# Patient Record
Sex: Male | Born: 1965 | Race: Black or African American | Hispanic: No | Marital: Married | Smoking: Never smoker
Health system: Southern US, Community
[De-identification: ages and names within clinical notes are randomized; demographics above are authoritative.]

## PROBLEM LIST (undated history)

## (undated) DIAGNOSIS — E785 Hyperlipidemia, unspecified: Secondary | ICD-10-CM

## (undated) DIAGNOSIS — G4700 Insomnia, unspecified: Secondary | ICD-10-CM

## (undated) DIAGNOSIS — I10 Essential (primary) hypertension: Secondary | ICD-10-CM

## (undated) DIAGNOSIS — E119 Type 2 diabetes mellitus without complications: Secondary | ICD-10-CM

## (undated) HISTORY — DX: Hyperlipidemia, unspecified: E78.5

## (undated) HISTORY — DX: Essential (primary) hypertension: I10

## (undated) HISTORY — DX: Type 2 diabetes mellitus without complications: E11.9

## (undated) HISTORY — DX: Insomnia, unspecified: G47.00

## (undated) HISTORY — PX: WISDOM TOOTH EXTRACTION: SHX21

---

## 1997-12-09 ENCOUNTER — Emergency Department (HOSPITAL_COMMUNITY): Admission: EM | Admit: 1997-12-09 | Discharge: 1997-12-09 | Payer: Self-pay | Admitting: Emergency Medicine

## 2011-08-19 ENCOUNTER — Emergency Department (HOSPITAL_COMMUNITY): Payer: No Typology Code available for payment source

## 2011-08-19 ENCOUNTER — Emergency Department (HOSPITAL_COMMUNITY)
Admission: EM | Admit: 2011-08-19 | Discharge: 2011-08-19 | Disposition: A | Discharge status: Home / Self Care | Payer: No Typology Code available for payment source | Attending: Emergency Medicine | Admitting: Emergency Medicine

## 2011-08-19 DIAGNOSIS — R51 Headache: Secondary | ICD-10-CM | POA: Insufficient documentation

## 2011-08-19 DIAGNOSIS — T1490XA Injury, unspecified, initial encounter: Secondary | ICD-10-CM | POA: Insufficient documentation

## 2011-08-19 DIAGNOSIS — M549 Dorsalgia, unspecified: Secondary | ICD-10-CM | POA: Insufficient documentation

## 2011-08-19 MED ORDER — OXYCODONE-ACETAMINOPHEN 5-325 MG PO TABS
1.0000 | ORAL_TABLET | Freq: Once | ORAL | Status: AC
  Administered 2011-08-19: 1 via ORAL
  Filled 2011-08-19: qty 1

## 2011-08-19 MED ORDER — OXYCODONE-ACETAMINOPHEN 5-325 MG PO TABS: 2.0000 | ORAL_TABLET | ORAL | Status: AC | PRN
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
## 2011-08-19 NOTE — ED Notes (Signed)
Patient transported to X-ray and returned 

## 2011-08-19 NOTE — ED Notes (Signed)
Rx given to pt. Family at bedside

## 2012-01-09 ENCOUNTER — Emergency Department: Payer: Self-pay | Admitting: Emergency Medicine
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
## 2013-05-12 IMAGING — CT CT HEAD WITHOUT CONTRAST
1 series · 16 of 30 positions shown, 20 images · non-contrast
Comparison: none

REASON FOR EXAM: headache
COMMENTS:

[Series 2: soft tissue · axial · 0.41mm/px · z∈[+397,+532]mm · 16 of 30 slices shown, 20 images]
[im 2/30  brain]
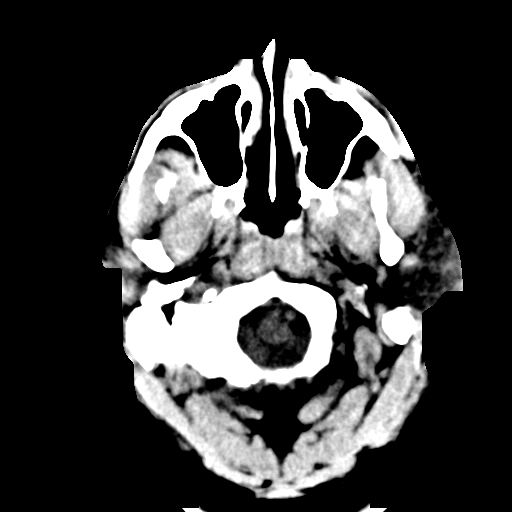
[im 2/30  bone]
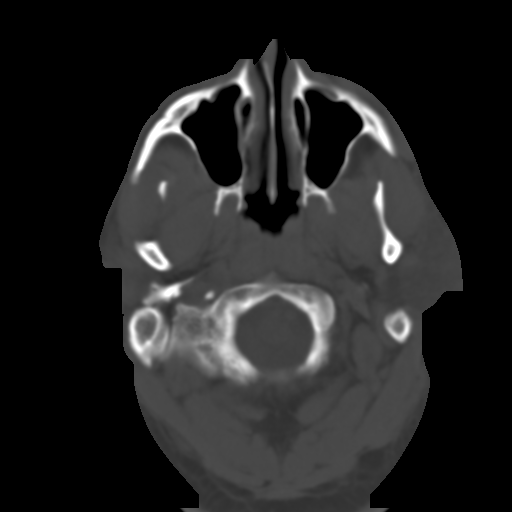
[im 4/30  brain]
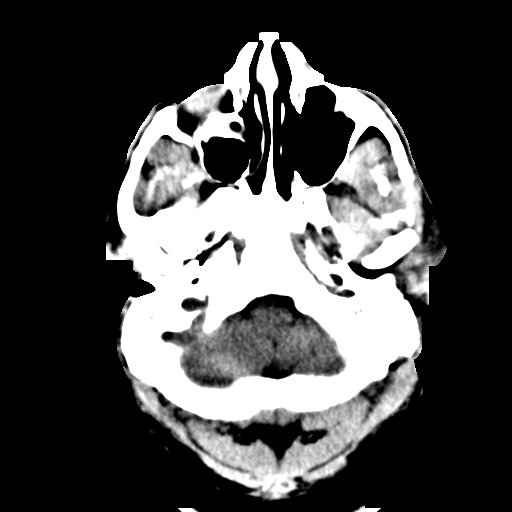
[im 6/30  brain]
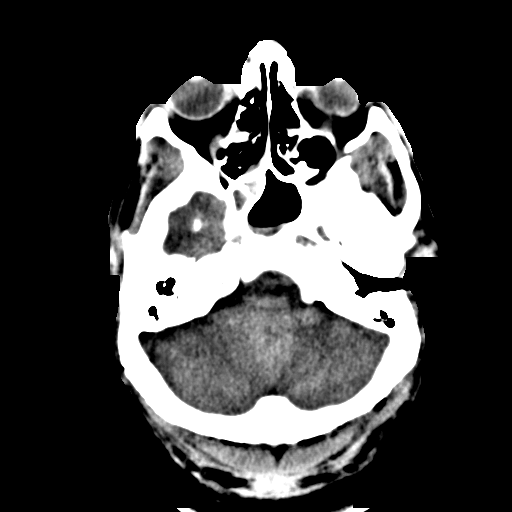
[im 8/30  brain]
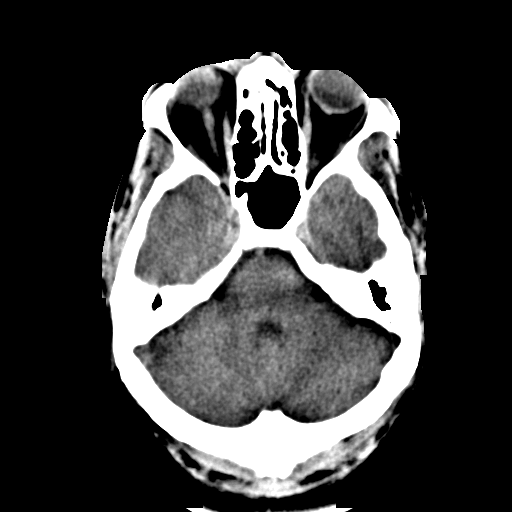
[im 9/30  brain]
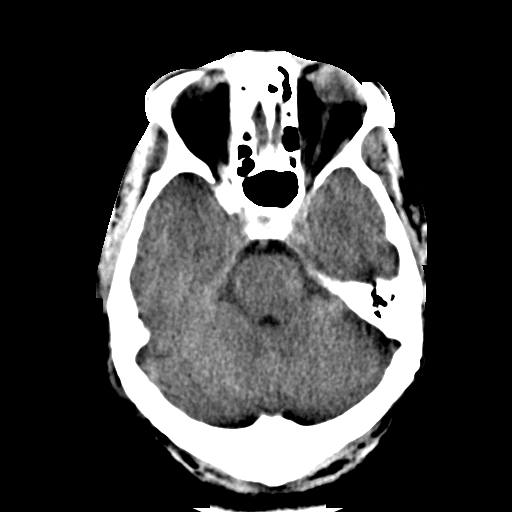
[im 9/30  bone]
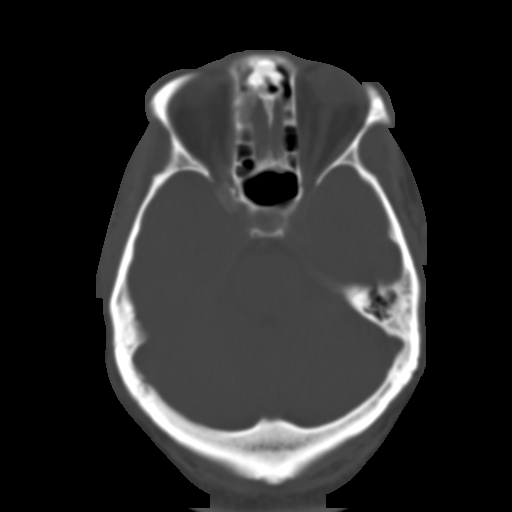
[im 11/30  brain]
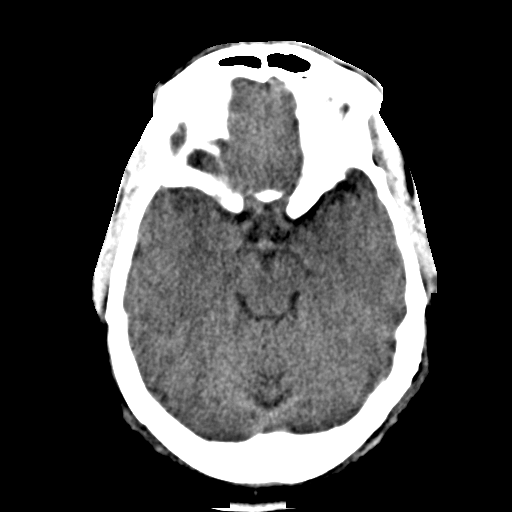
[im 13/30  brain]
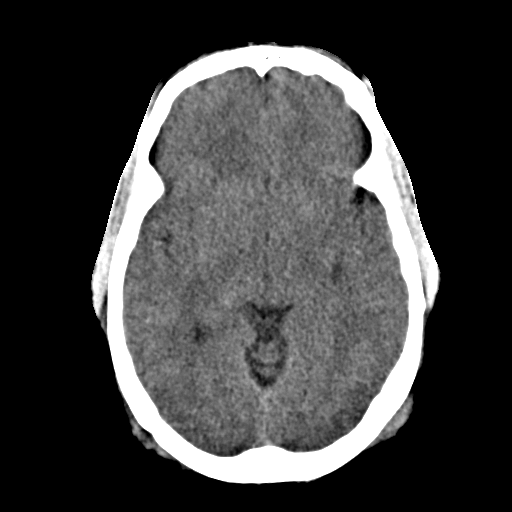
[im 15/30  brain]
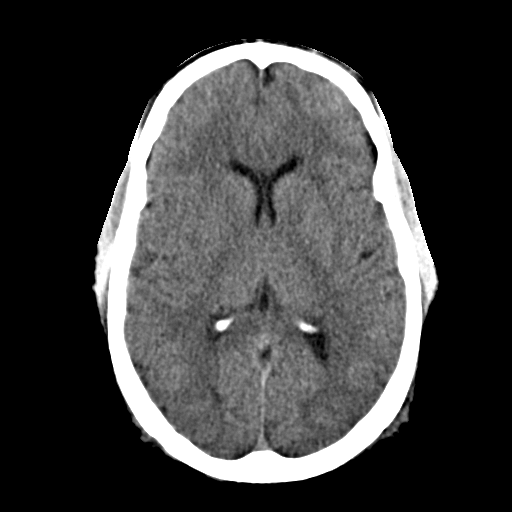
[im 16/30  brain]
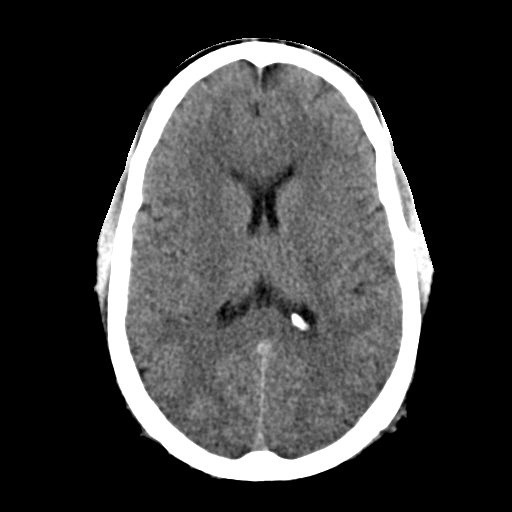
[im 16/30  bone]
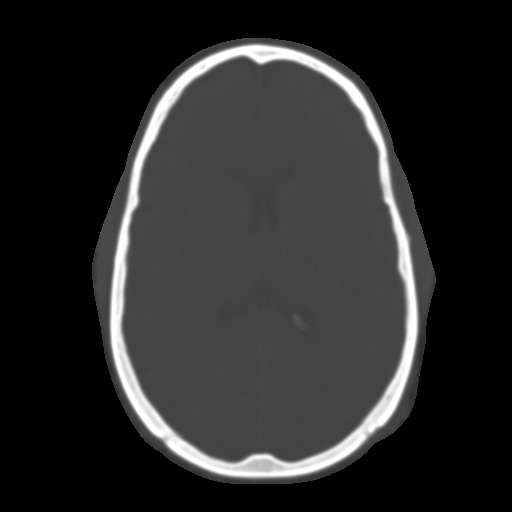
[im 18/30  brain]
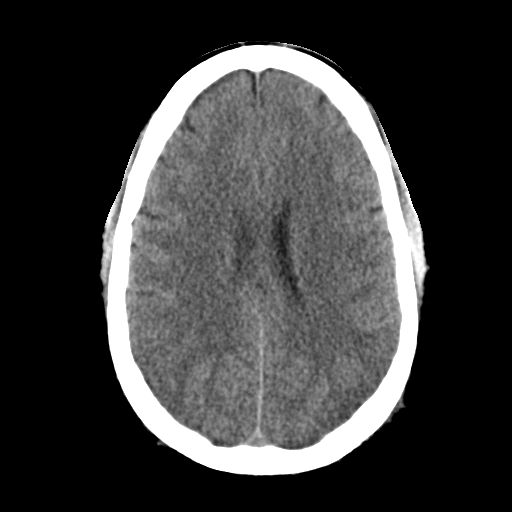
[im 20/30  brain]
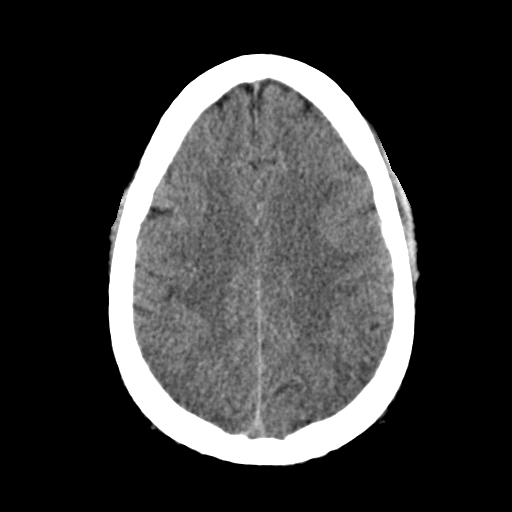
[im 22/30  brain]
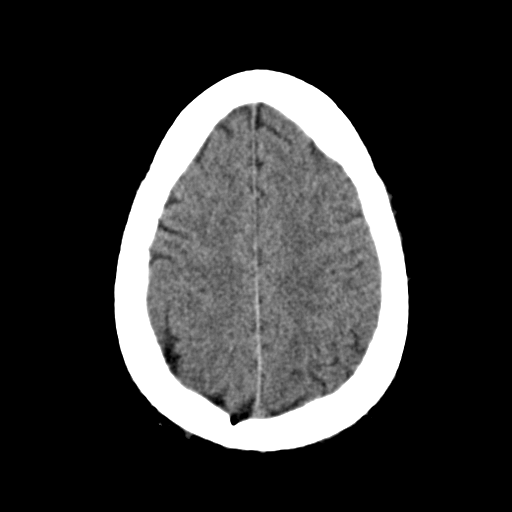
[im 23/30  brain]
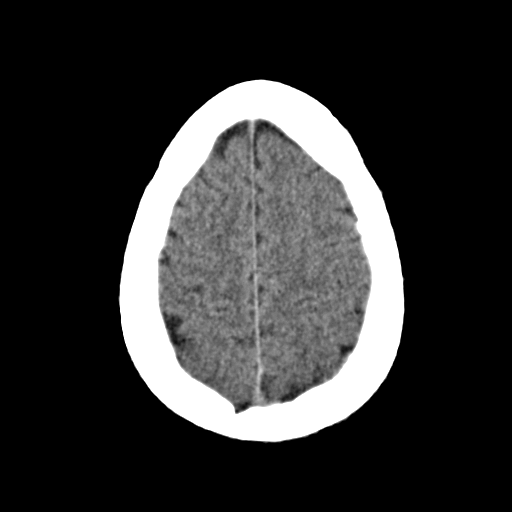
[im 23/30  bone]
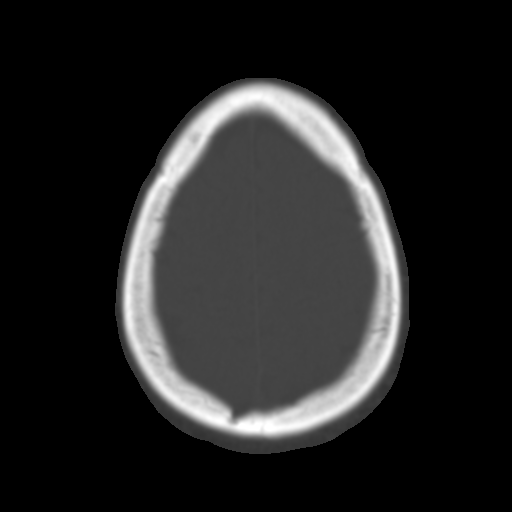
[im 25/30  brain]
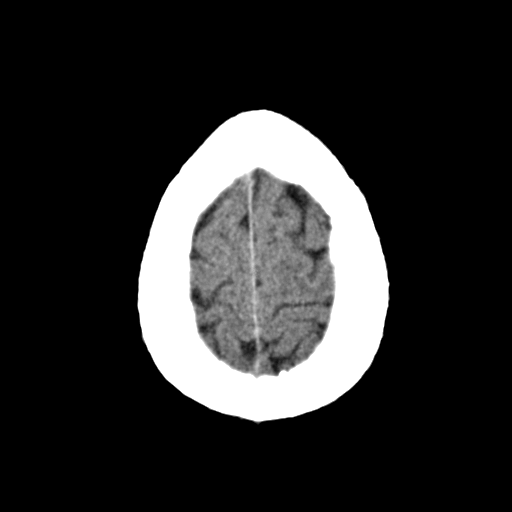
[im 27/30  brain]
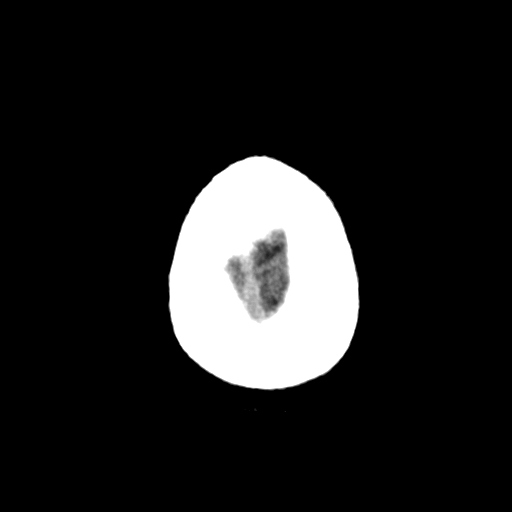
[im 29/30  brain]
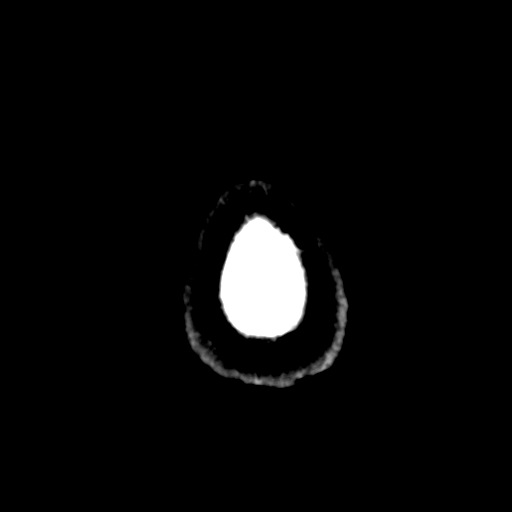

[16 of 30 positions shown; findings below may reference images not displayed]

PROCEDURE:     CT  - CT HEAD WITHOUT CONTRAST  - January 09, 2012  [DATE]

RESULT:     Axial noncontrast CT scanning was performed through the brain
with reconstructions at 5 mm intervals and slice thicknesses.

The ventricles are normal in size and position. There is no intracranial
hemorrhage nor intracranial mass effect. The cerebellum and brainstem are
normal in density.

At bone window settings there is fluid in the sphenoid sinus and in a few
posterior ethmoid sinus cells. There is no evidence of an acute skull
fracture.
IMPRESSION: 1. I see no acute abnormality the brain.
2. There is fluid in the sphenoid and posterior ethmoid sinus cells which
may reflect acute or chronic sinus inflammation.

## 2014-08-21 DIAGNOSIS — E1159 Type 2 diabetes mellitus with other circulatory complications: Secondary | ICD-10-CM | POA: Insufficient documentation

## 2014-08-21 DIAGNOSIS — E1169 Type 2 diabetes mellitus with other specified complication: Secondary | ICD-10-CM | POA: Insufficient documentation

## 2014-08-21 DIAGNOSIS — E669 Obesity, unspecified: Secondary | ICD-10-CM | POA: Insufficient documentation

## 2014-09-01 DIAGNOSIS — E669 Obesity, unspecified: Secondary | ICD-10-CM | POA: Insufficient documentation

## 2016-11-08 ENCOUNTER — Ambulatory Visit (INDEPENDENT_AMBULATORY_CARE_PROVIDER_SITE_OTHER): Payer: BLUE CROSS/BLUE SHIELD | Admitting: Podiatry

## 2016-11-08 ENCOUNTER — Encounter: Payer: Self-pay | Admitting: Podiatry

## 2016-11-08 VITALS — BP 163/105 | HR 92 | Resp 18

## 2016-11-08 DIAGNOSIS — L600 Ingrowing nail: Secondary | ICD-10-CM | POA: Diagnosis not present

## 2016-11-08 MED ORDER — CEPHALEXIN 500 MG PO CAPS: 500.0000 mg | ORAL_CAPSULE | Freq: Four times a day (QID) | ORAL | 0 refills | Status: DC

## 2016-11-08 NOTE — Patient Instructions (Signed)

## 2016-11-08 NOTE — Progress Notes (Signed)
Subjective:    Patient ID: Kenneth Cooper, male    DOB: [birth year], 51 y.o.   MRN: [id number]  HPI  51 year old male presents the office they for concerns of ingrown toenail to the right big toe and he points the lateral nail border. He states the areas painful pressure in shoes. His been ongoing for several weeks. He states this started to drain some. Has had no recent treatment for this. Denies any recent injury or trauma. He is no other complaints today.  He is unsure of his last A1c her blood sugar he does not check her regularly. He denies any claudication symptoms and denies any numbness or tingling.  Review of Systems  All other systems reviewed and are negative.      Objective:   Physical Exam General: AAO x3, NAD  Dermatological: There is incurvation along the lateral aspect of the right hallux toenail tenderness palpation. This granular lesion tissue present within the nail border in a small amount of serous drainage is expressed. There is no pus. There is mild edema along the nail border but there is no ascending cellulitis. There is no other open lesions or pre-ulcerative lesions. There is no sign of the incurvation along the medial nail border today there is no pain in the area.  Vascular: Dorsalis Pedis artery and Posterior Tibial artery pedal pulses are 2/4 bilateral with immedate capillary fill time. Pedal hair growth present. No varicosities and no lower extremity edema present bilateral. There is no pain with calf compression, swelling, warmth, erythema.   Neruologic: Grossly intact via light touch bilateral. Vibratory intact via tuning fork bilateral. Protective threshold with Semmes Wienstein monofilament intact to all pedal sites bilateral.   Musculoskeletal: No gross boney pedal deformities bilateral. No pain, crepitus, or limitation noted with foot and ankle range of motion bilateral. Muscular strength 5/5 in all groups tested bilateral.  Gait: Unassisted,  Nonantalgic.      Assessment & Plan:  51 year old male right lateral hallux and medical ingrown toenail -Treatment options discussed including all alternatives, risks, and complications -Etiology of symptoms were discussed -At this time, the patient is requesting partial nail removal with chemical matricectomy to the symptomatic portion of the nail. Risks and complications were discussed with the patient for which they understand and  verbally consent to the procedure. Under sterile conditions a total of 3 mL of a mixture of 2% lidocaine plain and 0.5% Marcaine plain was infiltrated in a hallux block fashion. Once anesthetized, the skin was prepped in sterile fashion. A tourniquet was then applied. Next the lateral aspect of hallux nail border was then sharply excised making sure to remove the entire offending nail border. Once the nails were ensured to be removed area was debrided and the underlying skin was intact. There is no purulence identified in the procedure. Next phenol was then applied under standard conditions and copiously irrigated. Silvadene was applied. A dry sterile dressing was applied. After application of the dressing the tourniquet was removed and there is found to be an immediate capillary refill time to the digit. The patient tolerated the procedure well any complications. Post procedure instructions were discussed the patient for which he verbally understood. Follow-up in one week for nail check or sooner if any problems are to arise. Discussed signs/symptoms of infection and directed to call the office immediately should any occur or go directly to the emergency room. In the meantime, encouraged to call the office with any questions, concerns, changes symptoms. -Keflex  Kenneth Cooper, DPM

## 2016-11-20 ENCOUNTER — Ambulatory Visit: Payer: 59 | Admitting: Podiatry

## 2016-12-04 ENCOUNTER — Encounter: Payer: Self-pay | Admitting: Podiatry

## 2016-12-04 ENCOUNTER — Ambulatory Visit (INDEPENDENT_AMBULATORY_CARE_PROVIDER_SITE_OTHER): Payer: BLUE CROSS/BLUE SHIELD | Admitting: Podiatry

## 2016-12-04 DIAGNOSIS — S91209D Unspecified open wound of unspecified toe(s) with damage to nail, subsequent encounter: Secondary | ICD-10-CM

## 2016-12-04 DIAGNOSIS — M79676 Pain in unspecified toe(s): Secondary | ICD-10-CM

## 2016-12-04 DIAGNOSIS — S91109D Unspecified open wound of unspecified toe(s) without damage to nail, subsequent encounter: Secondary | ICD-10-CM | POA: Diagnosis not present

## 2016-12-04 NOTE — Progress Notes (Signed)
   Subjective: Patient presents today 2 weeks post ingrown nail permanent nail avulsion procedure. Patient states that the toe and nail fold is feeling much better.  Objective: Skin is warm, dry and supple. Nail and respective nail fold appears to be healing appropriately. Open wound to the associated nail fold with a granular wound base and moderate amount of fibrotic tissue. Minimal drainage noted. Mild erythema around the periungual region likely due to phenol chemical matricectomy.  Assessment: #1 postop permanent partial nail avulsion #2 open wound periungual nail fold of respective digit.   Plan of care: #1 patient was evaluated  #2 debridement of open wound was performed to the periungual border of the respective toe using a currette. Antibiotic ointment and Band-Aid was applied. #3 patient is to return to clinic on a PRN  basis.   Claron Rosencrans M. Saleen Peden, DPM [facility]  Dr. Fortune Brannigan M. Briana Newman, DPM    [address]                                        [place], [place] [address]                Office [phone]  Fax [phone]      

## 2017-05-14 DIAGNOSIS — E1169 Type 2 diabetes mellitus with other specified complication: Secondary | ICD-10-CM | POA: Insufficient documentation

## 2020-04-12 DIAGNOSIS — G4733 Obstructive sleep apnea (adult) (pediatric): Secondary | ICD-10-CM | POA: Insufficient documentation

## 2021-04-14 LAB — HM DIABETES EYE EXAM

## 2021-11-02 ENCOUNTER — Ambulatory Visit
Admission: EM | Admit: 2021-11-02 | Discharge: 2021-11-02 | Disposition: A | Discharge status: Home / Self Care | Payer: BC Managed Care – PPO | Attending: Emergency Medicine | Admitting: Emergency Medicine

## 2021-11-02 ENCOUNTER — Other Ambulatory Visit: Payer: Self-pay

## 2021-11-02 DIAGNOSIS — S29019A Strain of muscle and tendon of unspecified wall of thorax, initial encounter: Secondary | ICD-10-CM | POA: Diagnosis not present

## 2021-11-02 DIAGNOSIS — Z8679 Personal history of other diseases of the circulatory system: Secondary | ICD-10-CM | POA: Diagnosis not present

## 2021-11-02 MED ORDER — HYDROCODONE-ACETAMINOPHEN 5-325 MG PO TABS: 1.0000 | ORAL_TABLET | Freq: Four times a day (QID) | ORAL | 0 refills | Status: AC | PRN

## 2021-11-02 MED ORDER — HYDROCODONE-ACETAMINOPHEN 5-325 MG PO TABS: 2.0000 | ORAL_TABLET | Freq: Four times a day (QID) | ORAL | 0 refills | Status: DC | PRN

## 2021-11-02 MED ORDER — CYCLOBENZAPRINE HCL 10 MG PO TABS: 10.0000 mg | ORAL_TABLET | Freq: Two times a day (BID) | ORAL | 0 refills | Status: AC | PRN

## 2021-11-02 NOTE — ED Provider Notes (Signed)
[facility]    CSN: [id number] Arrival date & time: 11/02/21  1601      History   Chief Complaint Chief Complaint  Patient presents with   Back Pain    HPI Kenneth Cooper is a 56 y.o. male.   56 year old male patient, Kenneth Cooper, presents to urgent care for chief complaint of right-sided thoracic back pain since yesterday.  Patient states he was lifting something heavy and felt a twinge on the right side.  Patient states he has been taken over-the-counter back pain medicine without relief.  Patient is past medical history of diabetes and hypertension which is controlled.  PCP is Dr. Dareen Piano in [place] [facility].  The history is provided by the patient. No language interpreter was used.  Back Pain Location:  Thoracic spine Quality:  Aching Radiates to:  Does not radiate Pain severity:  Severe Pain is:  Same all the time Onset quality:  Sudden Duration:  1 day Timing:  Constant Progression:  Waxing and waning Chronicity:  New Context: lifting heavy objects   Relieved by:  Nothing Worsened by:  Movement Ineffective treatments:  OTC medications Associated symptoms: no fever    Past Medical History:  Diagnosis Date   Diabetes mellitus without complication (HCC)    Hypertension     Patient Active Problem List   Diagnosis Date Noted   History of hypertension 11/02/2021   Thoracic myofascial strain 11/02/2021   Ingrown toenail 11/08/2016    History reviewed. No pertinent surgical history.     Home Medications    Prior to Admission medications   Medication Sig Start Date End Date Taking? Authorizing Provider  amLODipine (NORVASC) 5 MG tablet Take 5 mg by mouth daily. 10/21/16  Yes [provider]  cyclobenzaprine (FLEXERIL) 10 MG tablet Take 1 tablet (10 mg total) by mouth 2 (two) times daily as needed for up to 5 days for muscle spasms. 11/02/21 11/07/21 Yes Persia Lintner, Para March, NP  HYDROcodone-acetaminophen (NORCO/VICODIN) 5-325 MG tablet Take 1  tablet by mouth every 6 (six) hours as needed for up to 3 days. 11/02/21 11/05/21 Yes Rafia Shedden, Para March, NP  losartan (COZAAR) 100 MG tablet TAKE 1 TABLET (100 MG TOTAL) BY MOUTH ONCE DAILY. 10/08/16  Yes [provider]  losartan-hydrochlorothiazide (HYZAAR) 100-12.5 MG tablet Take by mouth. 10/17/16 10/17/17  [provider]  metFORMIN (GLUCOPHAGE-XR) 500 MG 24 hr tablet Take by mouth. 10/19/16 10/19/17  [provider]    Family History History reviewed. No pertinent family history.  Social History Social History   Tobacco Use   Smoking status: Never   Smokeless tobacco: Never  Vaping Use   Vaping Use: Never used  Substance Use Topics   Alcohol use: No   Drug use: No     Allergies   Patient has no known allergies.   Review of Systems Review of Systems  Constitutional:  Negative for fever.  Musculoskeletal:  Positive for back pain and myalgias.  Skin:  Negative for rash.  All other systems reviewed and are negative.   Physical Exam Triage Vital Signs ED Triage Vitals  Enc Vitals Group     BP      Pulse      Resp      Temp      Temp src      SpO2      Weight      Height      Head Circumference      Peak Flow  Pain Score      Pain Loc      Pain Edu?      Excl. in GC?    No data found.  Updated Vital Signs BP (!) 154/89 (BP Location: Left Arm)    Pulse 83    Temp 98.2 F (36.8 C) (Oral)    Resp 18    Ht 5\' 6"  (1.676 m)    Wt 237 lb (107.5 kg)    SpO2 100%    BMI 38.25 kg/m   Visual Acuity Right Eye Distance:   Left Eye Distance:   Bilateral Distance:    Right Eye Near:   Left Eye Near:    Bilateral Near:     Physical Exam Vitals and nursing note reviewed.  Constitutional:      General: He is not in acute distress.    Appearance: Normal appearance. He is well-developed. He is not ill-appearing or toxic-appearing.  HENT:     Head: Normocephalic.     Right Ear: Tympanic membrane normal.     Left Ear: Tympanic membrane  normal.     Nose: Nose normal.     Mouth/Throat:     Mouth: Mucous membranes are not dry.     Pharynx: Uvula midline.  Eyes:     General: Lids are normal.     Conjunctiva/sclera: Conjunctivae normal.     Pupils: Pupils are equal, round, and reactive to light.  Neck:     Trachea: Trachea normal.  Cardiovascular:     Rate and Rhythm: Normal rate and regular rhythm.     Pulses: Normal pulses.     Heart sounds: Normal heart sounds.  Pulmonary:     Effort: Pulmonary effort is normal.     Breath sounds: Normal breath sounds and air entry.  Abdominal:     General: Bowel sounds are normal.     Palpations: Abdomen is soft.     Tenderness: There is no abdominal tenderness. There is no guarding or rebound.     Hernia: No hernia is present.  Musculoskeletal:        General: Normal range of motion.     Cervical back: Normal range of motion.     Thoracic back: Spasms and tenderness present.       Back:  Skin:    General: Skin is warm and dry.     Findings: No rash.  Neurological:     General: No focal deficit present.     Mental Status: He is alert and oriented to person, place, and time.     GCS: GCS eye subscore is 4. GCS verbal subscore is 5. GCS motor subscore is 6.     Cranial Nerves: Cranial nerves 2-12 are intact.     Sensory: Sensation is intact.     Motor: Motor function is intact.     Coordination: Coordination is intact.  Psychiatric:        Attention and Perception: Attention normal.        Mood and Affect: Mood normal.        Speech: Speech normal.        Behavior: Behavior normal. Behavior is cooperative.     UC Treatments / Results  Labs (all labs ordered are listed, but only abnormal results are displayed) Labs Reviewed - No data to display  EKG   Radiology No results found.  Procedures Procedures (including critical care time)  Medications Ordered in UC Medications - No data to display  Initial Impression /  Assessment and Plan / UC Course  I have  reviewed the triage vital signs and the nursing notes.  Pertinent labs & imaging results that were available during my care of the patient were reviewed by me and considered in my medical decision making (see chart for details).     Ddx: Thoracic strain, muscle spasm Final Clinical Impressions(s) / UC Diagnoses   Final diagnoses:  History of hypertension  Thoracic myofascial strain, initial encounter     Discharge Instructions      Your blood pressure was elevated today, please follow up with PCP to have it rechecked. In a couple of days. Take meds as directed. Do not take meds and drive or work with them(it will make you sleepy).      ED Prescriptions     Medication Sig Dispense Auth. Provider   cyclobenzaprine (FLEXERIL) 10 MG tablet Take 1 tablet (10 mg total) by mouth 2 (two) times daily as needed for up to 5 days for muscle spasms. 10 tablet Cyndra Feinberg, NP   HYDROcodone-acetaminophen (NORCO/VICODIN) 5-325 MG tablet  (Status: Discontinued) Take 2 tablets by mouth every 6 (six) hours as needed for up to 3 days. 10 tablet Teckla Christiansen, NP   HYDROcodone-acetaminophen (NORCO/VICODIN) 5-325 MG tablet Take 1 tablet by mouth every 6 (six) hours as needed for up to 3 days. 10 tablet Ling Flesch, Para March, NP      I have reviewed the PDMP during this encounter.   Clancy Gourd, NP 11/02/21 [phone]

## 2021-11-02 NOTE — Discharge Instructions (Addendum)
Your blood pressure was elevated today, please follow up with PCP to have it rechecked. In a couple of days. Take meds as directed. Do not take meds and drive or work with them(it will make you sleepy).

## 2021-11-02 NOTE — ED Triage Notes (Signed)
Pt c/o right sided back pain near shoulder blade x1day   Pt was at work last night at work and put pressure on his right side and it caused his right shoulder to hurt.

## 2022-02-12 DIAGNOSIS — Z125 Encounter for screening for malignant neoplasm of prostate: Secondary | ICD-10-CM | POA: Diagnosis not present

## 2022-02-12 DIAGNOSIS — E119 Type 2 diabetes mellitus without complications: Secondary | ICD-10-CM | POA: Diagnosis not present

## 2022-02-12 DIAGNOSIS — Z23 Encounter for immunization: Secondary | ICD-10-CM | POA: Diagnosis not present

## 2022-02-12 DIAGNOSIS — Z0000 Encounter for general adult medical examination without abnormal findings: Secondary | ICD-10-CM | POA: Diagnosis not present

## 2022-02-12 DIAGNOSIS — Z1331 Encounter for screening for depression: Secondary | ICD-10-CM | POA: Diagnosis not present

## 2022-02-12 DIAGNOSIS — E782 Mixed hyperlipidemia: Secondary | ICD-10-CM | POA: Diagnosis not present

## 2022-02-12 DIAGNOSIS — I10 Essential (primary) hypertension: Secondary | ICD-10-CM | POA: Diagnosis not present

## 2022-06-20 ENCOUNTER — Ambulatory Visit
Admission: EM | Admit: 2022-06-20 | Discharge: 2022-06-20 | Disposition: A | Discharge status: Home / Self Care | Payer: BC Managed Care – PPO

## 2022-06-20 DIAGNOSIS — S29019A Strain of muscle and tendon of unspecified wall of thorax, initial encounter: Secondary | ICD-10-CM

## 2022-06-20 MED ORDER — PREDNISONE 10 MG (21) PO TBPK: ORAL_TABLET | ORAL | 0 refills | Status: DC

## 2022-06-20 MED ORDER — BACLOFEN 10 MG PO TABS: 10.0000 mg | ORAL_TABLET | Freq: Three times a day (TID) | ORAL | 0 refills | Status: DC

## 2022-06-20 MED ORDER — DEXAMETHASONE SODIUM PHOSPHATE 10 MG/ML IJ SOLN
10.0000 mg | Freq: Once | INTRAMUSCULAR | Status: AC
  Administered 2022-06-20: 10 mg via INTRAMUSCULAR

## 2022-06-20 NOTE — ED Provider Notes (Signed)
[facility]    CSN: [id number] Arrival date & time: 06/20/22  1951      History   Chief Complaint Chief Complaint  Patient presents with   Back Pain    HPI Kenneth Cooper is a 56 y.o. male.   HPI  89 old male here for evaluation of back pain.  Patient reports that he has had pain in his right mid back since he woke up this morning.  He states it is on the right side and increases with movement.  He took aspirin with mild relief of his pain.  He denies any injury or heavy lifting.  The pain does not radiate down his legs or crossover to the left side of his back.  Past Medical History:  Diagnosis Date   Diabetes mellitus without complication ([place])    Hypertension     Patient Active Problem List   Diagnosis Date Noted   History of hypertension 11/02/2021   Thoracic myofascial strain 11/02/2021   Ingrown toenail 11/08/2016    History reviewed. No pertinent surgical history.     Home Medications    Prior to Admission medications   Medication Sig Start Date End Date Taking? Authorizing Provider  amLODipine (NORVASC) 5 MG tablet Take 5 mg by mouth daily. 10/21/16  Yes [provider]  glipiZIDE-metformin (METAGLIP) 5-500 MG tablet Take 1 tablet by mouth 2 (two) times daily with a meal.   Yes [provider]  losartan (COZAAR) 100 MG tablet TAKE 1 TABLET (100 MG TOTAL) BY MOUTH ONCE DAILY. 10/08/16  Yes [provider]  losartan (COZAAR) 100 MG tablet Take 1 tablet by mouth daily.   Yes [provider]  pioglitazone (ACTOS) 30 MG tablet Take 1 tablet by mouth daily. 07/14/20  Yes [provider]  rosuvastatin (CRESTOR) 10 MG tablet Take 1 tablet by mouth daily. 04/16/22  Yes [provider]  Semaglutide (RYBELSUS) 7 MG TABS TAKE 7 MG BY MOUTH ONCE DAILY DO NOT CUT, CRUSH, OR CHEW   Yes [provider]  traZODone (DESYREL) 50 MG tablet TAKE 1 2 TABLETS (50 100 MG TOTAL) BY MOUTH NIGHTLY   Yes [provider]  amLODipine (NORVASC) 5 MG tablet Take 1 tablet by mouth daily.    [provider]  baclofen (LIORESAL) 10 MG tablet Take 1 tablet (10 mg total) by mouth 3 (three) times daily. 06/20/22   Margarette Canada, NP  losartan-hydrochlorothiazide (HYZAAR) 100-12.5 MG tablet Take by mouth. 10/17/16 10/17/17  [provider]  metFORMIN (GLUCOPHAGE-XR) 500 MG 24 hr tablet Take by mouth. 10/19/16 10/19/17  [provider]  predniSONE (STERAPRED UNI-PAK 21 TAB) 10 MG (21) TBPK tablet Take 6 tablets on day 1, 5 tablets day 2, 4 tablets day 3, 3 tablets day 4, 2 tablets day 5, 1 tablet day 6 06/20/22   Margarette Canada, NP    Family History History reviewed. No pertinent family history.  Social History Social History   Tobacco Use   Smoking status: Never   Smokeless tobacco: Never  Vaping Use   Vaping Use: Never used  Substance Use Topics   Alcohol use: No   Drug use: No     Allergies   Patient has no known allergies.   Review of Systems Review of Systems  Constitutional:  Negative for fever.  Respiratory:  Negative for cough and shortness of breath.   Musculoskeletal:  Positive for back pain.  Hematological: Negative.   Psychiatric/Behavioral: Negative.  Physical Exam Triage Vital Signs ED Triage Vitals  Enc Vitals Group     BP 06/20/22 2000 (!) 165/90     Pulse Rate 06/20/22 2000 72     Resp --      Temp 06/20/22 2000 98.5 F (36.9 C)     Temp Source 06/20/22 2000 Oral     SpO2 06/20/22 2000 100 %     Weight 06/20/22 1958 225 lb (102.1 kg)     Height 06/20/22 1958 5\' 6"  (1.676 m)     Head Circumference --      Peak Flow --      Pain Score 06/20/22 1958 10     Pain Loc --      Pain Edu? --      Excl. in GC? --    No data found.  Updated Vital Signs BP (!) 165/90 (BP Location: Right Arm)   Pulse 72   Temp 98.5 F (36.9 C) (Oral)   Ht 5\' 6"  (1.676 m)   Wt 225 lb (102.1 kg)   SpO2 100%   BMI 36.32 kg/m   Visual Acuity Right Eye  Distance:   Left Eye Distance:   Bilateral Distance:    Right Eye Near:   Left Eye Near:    Bilateral Near:     Physical Exam Vitals and nursing note reviewed.  Constitutional:      Appearance: Normal appearance.  HENT:     Head: Normocephalic and atraumatic.  Cardiovascular:     Rate and Rhythm: Normal rate and regular rhythm.     Pulses: Normal pulses.     Heart sounds: Normal heart sounds. No murmur heard.    No friction rub. No gallop.  Pulmonary:     Effort: Pulmonary effort is normal.     Breath sounds: Normal breath sounds. No wheezing, rhonchi or rales.  Musculoskeletal:        General: Tenderness present. No swelling, deformity or signs of injury.     Comments: Tension or muscle spasm in the right lower thoracic region.  No midline spinous tenderness.  Skin:    General: Skin is warm and dry.     Capillary Refill: Capillary refill takes less than 2 seconds.     Findings: No erythema or rash.  Neurological:     General: No focal deficit present.     Mental Status: He is alert and oriented to person, place, and time.  Psychiatric:        Mood and Affect: Mood normal.        Behavior: Behavior normal.        Thought Content: Thought content normal.        Judgment: Judgment normal.      UC Treatments / Results  Labs (all labs ordered are listed, but only abnormal results are displayed) Labs Reviewed - No data to display  EKG   Radiology No results found.  Procedures Procedures (including critical care time)  Medications Ordered in UC Medications  dexamethasone (DECADRON) injection 10 mg (has no administration in time range)    Initial Impression / Assessment and Plan / UC Course  I have reviewed the triage vital signs and the nursing notes.  Pertinent labs & imaging results that were available during my care of the patient were reviewed by me and considered in my medical decision making (see chart for details).   Patient is a nontoxic-appearing 75  old male here for right lower thoracic back pain that has been  going on since he woke up this morning.  The pain increases with movement and is not associated with any injury or heavy lifting.  Patient denies any shortness of breath, cough, or pain with deep respiration.  On exam patient does have tension and muscle spasm in his right lower trapezius and thoracic paraspinous region.  I will give him a shot of Decadron here in clinic to help with inflammation tonight and discharge him home on a prednisone pack and baclofen starting tomorrow morning at breakfast.  Also home physical therapy and moist heat.  Return precautions reviewed.  Work note provided.   Final Clinical Impressions(s) / UC Diagnoses   Final diagnoses:  Thoracic myofascial strain, initial encounter     Discharge Instructions      Take the prednisone starting tomorrow morning at breakfast. You will take it each morning for 6 days.  Take the baclofen, 10 mg every 8 hours, on a schedule for the next 48 hours and then as needed.  Apply moist heat to your back for 30 minutes at a time 2-3 times a day to improve blood flow to the area and help remove the lactic acid causing the spasm.  Follow the back exercises given at discharge.  Return for reevaluation for any new or worsening symptoms.      ED Prescriptions     Medication Sig Dispense Auth. Provider   predniSONE (STERAPRED UNI-PAK 21 TAB) 10 MG (21) TBPK tablet  (Status: Discontinued) Take 6 tablets on day 1, 5 tablets day 2, 4 tablets day 3, 3 tablets day 4, 2 tablets day 5, 1 tablet day 6 21 tablet Becky Augusta, NP   baclofen (LIORESAL) 10 MG tablet  (Status: Discontinued) Take 1 tablet (10 mg total) by mouth 3 (three) times daily. 30 each Becky Augusta, NP   baclofen (LIORESAL) 10 MG tablet Take 1 tablet (10 mg total) by mouth 3 (three) times daily. 30 each Becky Augusta, NP   predniSONE (STERAPRED UNI-PAK 21 TAB) 10 MG (21) TBPK tablet Take 6 tablets on day 1, 5  tablets day 2, 4 tablets day 3, 3 tablets day 4, 2 tablets day 5, 1 tablet day 6 21 tablet Becky Augusta, NP      PDMP not reviewed this encounter.   Becky Augusta, NP 06/20/22 2028

## 2022-06-20 NOTE — Discharge Instructions (Signed)
Take the prednisone starting tomorrow morning at breakfast. You will take it each morning for 6 days.  Take the baclofen, 10 mg every 8 hours, on a schedule for the next 48 hours and then as needed.  Apply moist heat to your back for 30 minutes at a time 2-3 times a day to improve blood flow to the area and help remove the lactic acid causing the spasm.  Follow the back exercises given at discharge.  Return for reevaluation for any new or worsening symptoms.

## 2022-06-20 NOTE — ED Triage Notes (Signed)
Pt c/o RT lower back pain onset since waking up this morning. Pt denies any injury

## 2022-08-14 DIAGNOSIS — E782 Mixed hyperlipidemia: Secondary | ICD-10-CM | POA: Diagnosis not present

## 2022-08-14 DIAGNOSIS — I10 Essential (primary) hypertension: Secondary | ICD-10-CM | POA: Diagnosis not present

## 2022-08-14 DIAGNOSIS — G4733 Obstructive sleep apnea (adult) (pediatric): Secondary | ICD-10-CM | POA: Diagnosis not present

## 2022-08-14 DIAGNOSIS — E119 Type 2 diabetes mellitus without complications: Secondary | ICD-10-CM | POA: Diagnosis not present

## 2023-04-09 DIAGNOSIS — G4733 Obstructive sleep apnea (adult) (pediatric): Secondary | ICD-10-CM | POA: Diagnosis not present

## 2023-04-09 DIAGNOSIS — I10 Essential (primary) hypertension: Secondary | ICD-10-CM | POA: Diagnosis not present

## 2023-04-09 DIAGNOSIS — E119 Type 2 diabetes mellitus without complications: Secondary | ICD-10-CM | POA: Diagnosis not present

## 2023-04-09 DIAGNOSIS — Z0000 Encounter for general adult medical examination without abnormal findings: Secondary | ICD-10-CM | POA: Diagnosis not present

## 2023-04-09 DIAGNOSIS — E782 Mixed hyperlipidemia: Secondary | ICD-10-CM | POA: Diagnosis not present

## 2023-04-19 DIAGNOSIS — Z0000 Encounter for general adult medical examination without abnormal findings: Secondary | ICD-10-CM | POA: Diagnosis not present

## 2023-08-01 DIAGNOSIS — R195 Other fecal abnormalities: Secondary | ICD-10-CM | POA: Diagnosis not present

## 2023-08-27 ENCOUNTER — Encounter: Payer: Self-pay | Admitting: *Deleted

## 2023-08-27 ENCOUNTER — Ambulatory Visit: Payer: BC Managed Care – PPO | Admitting: Certified Registered"

## 2023-08-27 ENCOUNTER — Ambulatory Visit
Admission: RE | Admit: 2023-08-27 | Discharge: 2023-08-27 | Disposition: A | Discharge status: Home / Self Care | Payer: BC Managed Care – PPO | Attending: Gastroenterology | Admitting: Gastroenterology

## 2023-08-27 ENCOUNTER — Encounter
Admission: RE | Disposition: A | Discharge status: Home / Self Care | Payer: Self-pay | Source: Home / Self Care | Attending: Gastroenterology

## 2023-08-27 DIAGNOSIS — K635 Polyp of colon: Secondary | ICD-10-CM | POA: Diagnosis not present

## 2023-08-27 DIAGNOSIS — Z7984 Long term (current) use of oral hypoglycemic drugs: Secondary | ICD-10-CM | POA: Insufficient documentation

## 2023-08-27 DIAGNOSIS — E119 Type 2 diabetes mellitus without complications: Secondary | ICD-10-CM | POA: Insufficient documentation

## 2023-08-27 DIAGNOSIS — K640 First degree hemorrhoids: Secondary | ICD-10-CM | POA: Insufficient documentation

## 2023-08-27 DIAGNOSIS — D123 Benign neoplasm of transverse colon: Secondary | ICD-10-CM | POA: Diagnosis not present

## 2023-08-27 DIAGNOSIS — R195 Other fecal abnormalities: Secondary | ICD-10-CM | POA: Diagnosis not present

## 2023-08-27 DIAGNOSIS — I10 Essential (primary) hypertension: Secondary | ICD-10-CM | POA: Diagnosis not present

## 2023-08-27 DIAGNOSIS — Z1211 Encounter for screening for malignant neoplasm of colon: Secondary | ICD-10-CM | POA: Diagnosis not present

## 2023-08-27 DIAGNOSIS — Z7985 Long-term (current) use of injectable non-insulin antidiabetic drugs: Secondary | ICD-10-CM | POA: Insufficient documentation

## 2023-08-27 DIAGNOSIS — K5730 Diverticulosis of large intestine without perforation or abscess without bleeding: Secondary | ICD-10-CM | POA: Diagnosis not present

## 2023-08-27 HISTORY — PX: COLONOSCOPY WITH PROPOFOL: SHX5780

## 2023-08-27 LAB — GLUCOSE, CAPILLARY: Glucose-Capillary: 171 mg/dL — ABNORMAL HIGH (ref 70–99)

## 2023-08-27 SURGERY — COLONOSCOPY WITH PROPOFOL
Anesthesia: General

## 2023-08-27 MED ORDER — PROPOFOL 10 MG/ML IV BOLUS
INTRAVENOUS | Status: DC | PRN
  Administered 2023-08-27: 40 mg via INTRAVENOUS
  Administered 2023-08-27: 30 mg via INTRAVENOUS
  Administered 2023-08-27: 60 mg via INTRAVENOUS

## 2023-08-27 MED ORDER — SODIUM CHLORIDE 0.9 % IV SOLN: INTRAVENOUS | Status: DC

## 2023-08-27 MED ORDER — LIDOCAINE HCL (CARDIAC) PF 100 MG/5ML IV SOSY
PREFILLED_SYRINGE | INTRAVENOUS | Status: DC | PRN
  Administered 2023-08-27: 100 mg via INTRAVENOUS

## 2023-08-27 MED ORDER — PROPOFOL 500 MG/50ML IV EMUL
INTRAVENOUS | Status: DC | PRN
  Administered 2023-08-27: 165 ug/kg/min via INTRAVENOUS

## 2023-08-27 NOTE — Anesthesia Procedure Notes (Signed)
Procedure Name: General with mask airway Date/Time: 08/27/2023 9:21 AM  Performed by: Mohammed Kindle, CRNAPre-anesthesia Checklist: Patient identified, Emergency Drugs available, Suction available and Patient being monitored Patient Re-evaluated:Patient Re-evaluated prior to induction Oxygen Delivery Method: Simple face mask Induction Type: IV induction Placement Confirmation: positive ETCO2, CO2 detector and breath sounds checked- equal and bilateral Dental Injury: Teeth and Oropharynx as per pre-operative assessment

## 2023-08-27 NOTE — Op Note (Signed)
[facility] Patient Name: Kenneth Cooper Procedure Date: 08/27/2023 9:01 AM MRN: [id number] Account #: [id number] Date of Birth: October 24, 1965 Admit Type: Outpatient Age: 57 Room: [facility] ENDO ROOM 1 Gender: Male Note Status: Finalized Instrument Name: Prentice Docker [id number] Procedure:             Colonoscopy Indications:           Heme positive stool Providers:             Eather Colas MD, MD Referring MD:          No Local Md, MD (Referring MD) Medicines:             Monitored Anesthesia Care Complications:         No immediate complications. Estimated blood loss:                         Minimal. Procedure:             Pre-Anesthesia Assessment:                        - Prior to the procedure, a History and Physical was                         performed, and patient medications and allergies were                         reviewed. The patient is competent. The risks and                         benefits of the procedure and the sedation options and                         risks were discussed with the patient. All questions                         were answered and informed consent was obtained.                         Patient identification and proposed procedure were                         verified by the physician, the nurse, the                         anesthesiologist, the anesthetist and the technician                         in the endoscopy suite. Mental Status Examination:                         alert and oriented. Airway Examination: normal                         oropharyngeal airway and neck mobility. Respiratory                         Examination: clear to auscultation. CV Examination:  normal. Prophylactic Antibiotics: The patient does not                         require prophylactic antibiotics. Prior                         Anticoagulants: The patient has taken no anticoagulant                         or  antiplatelet agents. ASA Grade Assessment: II - A                         patient with mild systemic disease. After reviewing                         the risks and benefits, the patient was deemed in                         satisfactory condition to undergo the procedure. The                         anesthesia plan was to use monitored anesthesia care                         (MAC). Immediately prior to administration of                         medications, the patient was re-assessed for adequacy                         to receive sedatives. The heart rate, respiratory                         rate, oxygen saturations, blood pressure, adequacy of                         pulmonary ventilation, and response to care were                         monitored throughout the procedure. The physical                         status of the patient was re-assessed after the                         procedure.                        After obtaining informed consent, the colonoscope was                         passed under direct vision. Throughout the procedure,                         the patient's blood pressure, pulse, and oxygen                         saturations were monitored continuously. The  Colonoscope was introduced through the anus and                         advanced to the the terminal ileum, with                         identification of the appendiceal orifice and IC                         valve. The colonoscopy was performed without                         difficulty. The patient tolerated the procedure well.                         The quality of the bowel preparation was excellent.                         The terminal ileum, ileocecal valve, appendiceal                         orifice, and rectum were photographed. Findings:      The perianal and digital rectal examinations were normal.      The terminal ileum appeared normal.      A 1 mm polyp was found in the  transverse colon. The polyp was sessile.       The polyp was removed with a cold snare. Resection and retrieval were       complete. Estimated blood loss was minimal.      A few small-mouthed diverticula were found in the sigmoid colon and       descending colon.      Internal hemorrhoids were found during retroflexion. The hemorrhoids       were Grade I (internal hemorrhoids that do not prolapse).      The exam was otherwise without abnormality on direct and retroflexion       views. Impression:            - The examined portion of the ileum was normal.                        - One 1 mm polyp in the transverse colon, removed with                         a cold snare. Resected and retrieved.                        - Diverticulosis in the sigmoid colon and in the                         descending colon.                        - Internal hemorrhoids.                        - The examination was otherwise normal on direct and                         retroflexion views. Recommendation:        -  Discharge patient to home.                        - Resume previous diet.                        - Continue present medications.                        - Await pathology results.                        - Repeat colonoscopy in 7-10 years for surveillance.                        - Return to referring physician as previously                         scheduled. Procedure Code(s):     --- Professional ---                        [phone], Colonoscopy, flexible; with removal of                         tumor(s), polyp(s), or other lesion(s) by snare                         technique Diagnosis Code(s):     --- Professional ---                        K64.0, First degree hemorrhoids                        D12.3, Benign neoplasm of transverse colon (hepatic                         flexure or splenic flexure)                        R19.5, Other fecal abnormalities                        K57.30, Diverticulosis of  large intestine without                         perforation or abscess without bleeding CPT copyright 2022 American Medical Association. All rights reserved. The codes documented in this report are preliminary and upon coder review may  be revised to meet current compliance requirements. Eather Colas MD, MD 08/27/2023 9:50:57 AM Number of Addenda: 0 Note Initiated On: 08/27/2023 9:01 AM Scope Withdrawal Time: 0 hours 10 minutes 40 seconds  Total Procedure Duration: 0 hours 15 minutes 14 seconds  Estimated Blood Loss:  Estimated blood loss was minimal.      [facility]

## 2023-08-27 NOTE — Anesthesia Preprocedure Evaluation (Signed)
Anesthesia Evaluation  Patient identified by MRN, date of birth, ID band Patient awake    Reviewed: Allergy & Precautions, H&P , NPO status , Patient's Chart, lab work & pertinent test results, reviewed documented beta blocker date and time   History of Anesthesia Complications Negative for: history of anesthetic complications  Airway Mallampati: III  TM Distance: >3 FB Neck ROM: full    Dental  (+) Dental Advidsory Given, Teeth Intact, Missing   Pulmonary neg shortness of breath, sleep apnea , neg COPD, neg recent URI   Pulmonary exam normal breath sounds clear to auscultation       Cardiovascular Exercise Tolerance: Good hypertension, (-) angina (-) Past MI and (-) Cardiac Stents Normal cardiovascular exam(-) dysrhythmias (-) Valvular Problems/Murmurs Rhythm:regular Rate:Normal     Neuro/Psych negative neurological ROS  negative psych ROS   GI/Hepatic negative GI ROS, Neg liver ROS,,,  Endo/Other  diabetes, Well Controlled, Type 2, Oral Hypoglycemic Agents    Renal/GU negative Renal ROS  negative genitourinary   Musculoskeletal   Abdominal   Peds  Hematology negative hematology ROS (+)   Anesthesia Other Findings Past Medical History: No date: Diabetes mellitus without complication (HCC) No date: Hypertension   Reproductive/Obstetrics negative OB ROS                             Anesthesia Physical Anesthesia Plan  ASA: 2  Anesthesia Plan: General   Post-op Pain Management:    Induction: Intravenous  PONV Risk Score and Plan: 2 and Propofol infusion and TIVA  Airway Management Planned: Natural Airway and Nasal Cannula  Additional Equipment:   Intra-op Plan:   Post-operative Plan:   Informed Consent: I have reviewed the patients History and Physical, chart, labs and discussed the procedure including the risks, benefits and alternatives for the proposed anesthesia with the  patient or authorized representative who has indicated his/her understanding and acceptance.     Dental Advisory Given  Plan Discussed with: Anesthesiologist, CRNA and Surgeon  Anesthesia Plan Comments:        Anesthesia Quick Evaluation

## 2023-08-27 NOTE — Interval H&P Note (Signed)
History and Physical Interval Note:  08/27/2023 9:05 AM  Kenneth Cooper  has presented today for surgery, with the diagnosis of Heme + Stool.  The various methods of treatment have been discussed with the patient and family. After consideration of risks, benefits and other options for treatment, the patient has consented to  Procedure(s): COLONOSCOPY WITH PROPOFOL (N/A) as a surgical intervention.  The patient's history has been reviewed, patient examined, no change in status, stable for surgery.  I have reviewed the patient's chart and labs.  Questions were answered to the patient's satisfaction.     Regis Bill  Ok to proceed with colonoscopy

## 2023-08-27 NOTE — Transfer of Care (Signed)
Immediate Anesthesia Transfer of Care Note  Patient: Kenneth Cooper  Procedure(s) Performed: COLONOSCOPY WITH PROPOFOL  Patient Location: Endoscopy Unit  Anesthesia Type:General  Level of Consciousness: drowsy and patient cooperative  Airway & Oxygen Therapy: Patient Spontanous Breathing and Patient connected to face mask oxygen  Post-op Assessment: Report given to RN and Post -op Vital signs reviewed and stable  Post vital signs: Reviewed and stable  Last Vitals:  Vitals Value Taken Time  BP 85/55 08/27/23 0942  Temp 36.1 C 08/27/23 0942  Pulse 91 08/27/23 0942  Resp 18 08/27/23 0942  SpO2 100 % 08/27/23 0942    Last Pain:  Vitals:   08/27/23 0942  TempSrc:   PainSc: Asleep         Complications: No notable events documented.

## 2023-08-27 NOTE — H&P (Signed)
Outpatient short stay form Pre-procedure 08/27/2023  Kenneth Bill, MD  Primary Physician: Medicine, [facility]  Reason for visit:  Heme positive stool  History of present illness:    57 y/o gentleman here for colonoscopy for heme positive stool. Has PMH of hypertension and DM II. Takes ozempic with last dose > 7 days ago. No blood thinners. No family history of GI malignancies. No significant abdominal surgeries. He has never had a colonoscopy before.    Current Facility-Administered Medications:    0.9 %  sodium chloride infusion, , Intravenous, Continuous, Glennda Weatherholtz, Rossie Muskrat, MD, Last Rate: 20 mL/hr at 08/27/23 0901, New Bag at 08/27/23 0901  Medications Prior to Admission  Medication Sig Dispense Refill Last Dose/Taking   amLODipine (NORVASC) 5 MG tablet Take 1 tablet by mouth daily.   08/26/2023   glipiZIDE-metformin (METAGLIP) 5-500 MG tablet Take 1 tablet by mouth 2 (two) times daily with a meal.   08/26/2023   losartan (COZAAR) 100 MG tablet Take 1 tablet by mouth daily.   08/26/2023   rosuvastatin (CRESTOR) 10 MG tablet Take 1 tablet by mouth daily.   08/26/2023   Semaglutide (OZEMPIC, 2 MG/DOSE, [place]) Inject into the skin.   08/12/2023   traZODone (DESYREL) 50 MG tablet TAKE 1 2 TABLETS (50 100 MG TOTAL) BY MOUTH NIGHTLY   Past Week   amLODipine (NORVASC) 5 MG tablet Take 5 mg by mouth daily.  3    baclofen (LIORESAL) 10 MG tablet Take 1 tablet (10 mg total) by mouth 3 (three) times daily. 30 each 0    losartan (COZAAR) 100 MG tablet TAKE 1 TABLET (100 MG TOTAL) BY MOUTH ONCE DAILY.  0    losartan-hydrochlorothiazide (HYZAAR) 100-12.5 MG tablet Take by mouth.      metFORMIN (GLUCOPHAGE-XR) 500 MG 24 hr tablet Take by mouth. (Patient not taking: Reported on 08/27/2023)   Not Taking   pioglitazone (ACTOS) 30 MG tablet Take 1 tablet by mouth daily.      predniSONE (STERAPRED UNI-PAK 21 TAB) 10 MG (21) TBPK tablet Take 6 tablets on day 1, 5 tablets day 2, 4 tablets day  3, 3 tablets day 4, 2 tablets day 5, 1 tablet day 6 21 tablet 0    Semaglutide (RYBELSUS) 7 MG TABS TAKE 7 MG BY MOUTH ONCE DAILY DO NOT CUT, CRUSH, OR CHEW (Patient not taking: Reported on 08/27/2023)   Not Taking     No Known Allergies   Past Medical History:  Diagnosis Date   Diabetes mellitus without complication (HCC)    Hypertension     Review of systems:  Otherwise negative.    Physical Exam  Gen: Alert, oriented. Appears stated age.  HEENT: PERRLA. Lungs: No respiratory distress CV: RRR Abd: soft, benign, no masses Ext: No edema    Planned procedures: Proceed with colonoscopy. The patient understands the nature of the planned procedure, indications, risks, alternatives and potential complications including but not limited to bleeding, infection, perforation, damage to internal organs and possible oversedation/side effects from anesthesia. The patient agrees and gives consent to proceed.  Please refer to procedure notes for findings, recommendations and patient disposition/instructions.     Kenneth Bill, MD [facility]

## 2023-08-28 ENCOUNTER — Encounter: Payer: Self-pay | Admitting: Gastroenterology

## 2023-08-28 LAB — SURGICAL PATHOLOGY

## 2023-08-29 NOTE — Anesthesia Postprocedure Evaluation (Signed)
Anesthesia Post Note  Patient: Kenneth Cooper  Procedure(s) Performed: COLONOSCOPY WITH PROPOFOL  Patient location during evaluation: Endoscopy Anesthesia Type: General Level of consciousness: awake and alert Pain management: pain level controlled Vital Signs Assessment: post-procedure vital signs reviewed and stable Respiratory status: spontaneous breathing, nonlabored ventilation, respiratory function stable and patient connected to nasal cannula oxygen Cardiovascular status: blood pressure returned to baseline and stable Postop Assessment: no apparent nausea or vomiting Anesthetic complications: no   No notable events documented.   Last Vitals:  Vitals:   08/27/23 1000 08/27/23 1002  BP:  121/80  Pulse: 87 82  Resp:    Temp:    SpO2: 99% 100%    Last Pain:  Vitals:   08/28/23 0738  TempSrc:   PainSc: 0-No pain                 Lenard Simmer

## 2023-12-09 DIAGNOSIS — E119 Type 2 diabetes mellitus without complications: Secondary | ICD-10-CM | POA: Insufficient documentation

## 2023-12-09 DIAGNOSIS — Z7985 Type 2 diabetes mellitus without complications: Secondary | ICD-10-CM | POA: Insufficient documentation

## 2023-12-09 NOTE — Patient Instructions (Addendum)
 Goal sugar is <130 fasting in the morning  Diabetes Mellitus and Foot Care Diabetes, also called diabetes mellitus, may cause problems with your feet and legs because of poor blood flow (circulation). Poor circulation may make your skin: Become thinner and drier. Break more easily. Heal more slowly. Peel and crack. You may also have nerve damage (neuropathy). This can cause decreased feeling in your legs and feet. This means that you may not notice minor injuries to your feet that could lead to more serious problems. Finding and treating problems early is the best way to prevent future foot problems. How to care for your feet Foot hygiene  Wash your feet daily with warm water and mild soap. Do not use hot water. Then, pat your feet and the areas between your toes until they are fully dry. Do not soak your feet. This can dry your skin. Trim your toenails straight across. Do not dig under them or around the cuticle. File the edges of your nails with an emery board or nail file. Apply a moisturizing lotion or petroleum jelly to the skin on your feet and to dry, brittle toenails. Use lotion that does not contain alcohol and is unscented. Do not apply lotion between your toes. Shoes and socks Wear clean socks or stockings every day. Make sure they are not too tight. Do not wear knee-high stockings. These may decrease blood flow to your legs. Wear shoes that fit well and have enough cushioning. Always look in your shoes before you put them on to be sure there are no objects inside. To break in new shoes, wear them for just a few hours a day. This prevents injuries on your feet. Wounds, scrapes, corns, and calluses  Check your feet daily for blisters, cuts, bruises, sores, and redness. If you cannot see the bottom of your feet, use a mirror or ask someone for help. Do not cut off corns or calluses or try to remove them with medicine. If you find a minor scrape, cut, or break in the skin on your feet,  keep it and the skin around it clean and dry. You may clean these areas with mild soap and water. Do not clean the area with peroxide, alcohol, or iodine. If you have a wound, scrape, corn, or callus on your foot, look at it several times a day to make sure it is healing and not infected. Check for: Redness, swelling, or pain. Fluid or blood. Warmth. Pus or a bad smell. General tips Do not cross your legs. This may decrease blood flow to your feet. Do not use heating pads or hot water bottles on your feet. They may burn your skin. If you have lost feeling in your feet or legs, you may not know this is happening until it is too late. Protect your feet from hot and cold by wearing shoes, such as at the beach or on hot pavement. Schedule a complete foot exam at least once a year or more often if you have foot problems. Report any cuts, sores, or bruises to your health care provider right away. Where to find more information American Diabetes Association: diabetes.org Association of Diabetes Care & Education Specialists: diabeteseducator.org Contact a health care provider if: You have a condition that increases your risk of infection, and you have any cuts, sores, or bruises on your feet. You have an injury that is not healing. You have redness on your legs or feet. You feel burning or tingling in your legs or  feet. You have pain or cramps in your legs and feet. Your legs or feet are numb. Your feet always feel cold. You have pain around any toenails. Get help right away if: You have a wound, scrape, corn, or callus on your foot and: You have signs of infection. You have a fever. You have a red line going up your leg. This information is not intended to replace advice given to you by your health care provider. Make sure you discuss any questions you have with your health care provider. Document Revised: 02/28/2022 Document Reviewed: 02/28/2022 Elsevier Patient Education  2024 [employer].

## 2023-12-10 ENCOUNTER — Ambulatory Visit: Payer: Self-pay | Admitting: Nurse Practitioner

## 2023-12-20 ENCOUNTER — Encounter: Payer: Self-pay | Admitting: Nurse Practitioner

## 2023-12-20 ENCOUNTER — Ambulatory Visit: Admitting: Nurse Practitioner

## 2023-12-20 VITALS — BP 126/72 | HR 83 | Temp 97.9°F | Ht 67.8 in | Wt 221.8 lb

## 2023-12-20 DIAGNOSIS — G4733 Obstructive sleep apnea (adult) (pediatric): Secondary | ICD-10-CM

## 2023-12-20 DIAGNOSIS — E119 Type 2 diabetes mellitus without complications: Secondary | ICD-10-CM | POA: Diagnosis not present

## 2023-12-20 DIAGNOSIS — G4700 Insomnia, unspecified: Secondary | ICD-10-CM | POA: Insufficient documentation

## 2023-12-20 DIAGNOSIS — E1159 Type 2 diabetes mellitus with other circulatory complications: Secondary | ICD-10-CM | POA: Diagnosis not present

## 2023-12-20 DIAGNOSIS — N400 Enlarged prostate without lower urinary tract symptoms: Secondary | ICD-10-CM

## 2023-12-20 DIAGNOSIS — Z114 Encounter for screening for human immunodeficiency virus [HIV]: Secondary | ICD-10-CM

## 2023-12-20 DIAGNOSIS — Z7985 Long-term (current) use of injectable non-insulin antidiabetic drugs: Secondary | ICD-10-CM | POA: Diagnosis not present

## 2023-12-20 DIAGNOSIS — Z1159 Encounter for screening for other viral diseases: Secondary | ICD-10-CM | POA: Diagnosis not present

## 2023-12-20 DIAGNOSIS — E669 Obesity, unspecified: Secondary | ICD-10-CM

## 2023-12-20 DIAGNOSIS — I152 Hypertension secondary to endocrine disorders: Secondary | ICD-10-CM

## 2023-12-20 DIAGNOSIS — E1169 Type 2 diabetes mellitus with other specified complication: Secondary | ICD-10-CM

## 2023-12-20 DIAGNOSIS — F5101 Primary insomnia: Secondary | ICD-10-CM

## 2023-12-20 DIAGNOSIS — Z7689 Persons encountering health services in other specified circumstances: Secondary | ICD-10-CM

## 2023-12-20 LAB — MICROALBUMIN, URINE WAIVED
Creatinine, Urine Waived: 100 mg/dL (ref 10–300)
Microalb, Ur Waived: 10 mg/L (ref 0–19)
Microalb/Creat Ratio: <30 mg/g (ref <30)

## 2023-12-20 MED ORDER — ACCU-CHEK SOFTCLIX LANCETS MISC: 12 refills | Status: DC

## 2023-12-20 MED ORDER — LOSARTAN POTASSIUM 100 MG PO TABS: 100.0000 mg | ORAL_TABLET | Freq: Every day | ORAL | 2 refills | Status: DC

## 2023-12-20 MED ORDER — CONTOUR NEXT MONITOR W/DEVICE KIT: PACK | 1 refills | Status: AC

## 2023-12-20 MED ORDER — AMLODIPINE BESYLATE 5 MG PO TABS: 5.0000 mg | ORAL_TABLET | Freq: Every day | ORAL | 2 refills | Status: DC

## 2023-12-20 MED ORDER — ROSUVASTATIN CALCIUM 10 MG PO TABS: 10.0000 mg | ORAL_TABLET | Freq: Every day | ORAL | 2 refills | Status: DC

## 2023-12-20 MED ORDER — TRAZODONE HCL 50 MG PO TABS: 50.0000 mg | ORAL_TABLET | Freq: Every evening | ORAL | 1 refills | Status: DC | PRN

## 2023-12-20 MED ORDER — CONTOUR NEXT TEST VI STRP: ORAL_STRIP | 12 refills | Status: AC

## 2023-12-20 NOTE — Assessment & Plan Note (Signed)
 Chronic, ongoing.  Continue current medication regimen and adjust as needed. Lipid panel today.

## 2023-12-20 NOTE — Assessment & Plan Note (Addendum)
 Is not using CPAP, discussed with him.  May benefit repeat study and different equipment in the future.  For now he prefers not to pursue this.

## 2023-12-20 NOTE — Progress Notes (Signed)
 New Patient Office Visit  Subjective    Patient ID: Kenneth Cooper, male    DOB: 1965/11/26  Age: 58 y.o. MRN: [id number]  CC:  Chief Complaint  Patient presents with   Diabetes   Hyperlipidemia   Hypertension   HPI Kenneth Cooper presents for new patient visit to establish care.  Introduced to Publishing rights manager role and practice setting.  All questions answered.  Discussed provider/patient relationship and expectations. Was being seen by Dr. Dareen Piano at [facility].  DIABETES Diagnosed many years ago.  Last A1c 9.6% in July 2024.  Currently takes Ozempic 0.5 MG.  Does not feel like he is losing weight as of yet with this.  Works at night, so often eats one meal a day.  Does drink Pepsi Zero.  All three sisters have diabetes.  Did not tolerate Jardiance, made him very tired.  Metformin caused him to feel tired and did not feel good.  Took Actos and Glipizide in past, does not recall these. Hypoglycemic episodes:no Polydipsia/polyuria: no Visual disturbance: no Chest pain: no Paresthesias: no Glucose Monitoring: no  Accucheck frequency: Not Checking  Fasting glucose:  Post prandial:  Evening:  Before meals: Taking Insulin?: no  Long acting insulin:  Short acting insulin: Blood Pressure Monitoring: not checking Retinal Examination: Not up to Date -- [place] [facility] Foot Exam: Not up to Date Diabetic Education: Not Completed Pneumovax: Up to Date Influenza: Up to Date Aspirin: no   HYPERTENSION / HYPERLIPIDEMIA Takes Amlodipine, Losartan, and Rosuvastatin.  Satisfied with current treatment? yes Duration of hypertension: chronic BP monitoring frequency: not checking BP range:  BP medication side effects: no Duration of hyperlipidemia: chronic Cholesterol medication side effects: no Cholesterol supplements: none Medication compliance: good compliance Aspirin: no Recent stressors: no Recurrent headaches: no Visual changes: no Palpitations: no Dyspnea: no Chest  pain: no Lower extremity edema: no Dizzy/lightheaded: no   INSOMNIA Takes Trazodone 50 MG nightly as needed.  Does have OSA, but does not use CPAP because it was leaking water -- did not try new equipment, just got pill.  Duration: years Satisfied with sleep quality: yes Difficulty falling asleep: yes Difficulty staying asleep: yes Waking a few hours after sleep onset: no Early morning awakenings: no Daytime hypersomnolence: no Wakes feeling refreshed:  sometimes Good sleep hygiene: yes Apnea: yes Snoring: no Depressed/anxious mood: no Recent stress: no Restless legs/nocturnal leg cramps: no Chronic pain/arthritis: no History of sleep study: yes Treatments attempted:  Trazodone and ambien       12/20/2023   10:28 AM  Depression screen PHQ 2/9  Decreased Interest 0  Down, Depressed, Hopeless 0  PHQ - 2 Score 0  Altered sleeping 0  Tired, decreased energy 0  Change in appetite 0  Feeling bad or failure about yourself  0  Trouble concentrating 0  Moving slowly or fidgety/restless 0  Suicidal thoughts 0  PHQ-9 Score 0  Difficult doing work/chores Not difficult at all       12/20/2023   10:29 AM  GAD 7 : Generalized Anxiety Score  Nervous, Anxious, on Edge 0  Control/stop worrying 0  Worry too much - different things 0  Trouble relaxing 0  Restless 0  Easily annoyed or irritable 0  Afraid - awful might happen 0  Total GAD 7 Score 0  Anxiety Difficulty Not difficult at all     Outpatient Encounter Medications as of 12/20/2023  Medication Sig   Accu-Chek Softclix Lancets lancets Use as instructed to check blood  sugar every morning.   Blood Glucose Monitoring Suppl (CONTOUR NEXT MONITOR) w/Device KIT To check blood sugar twice a day with goal fasting in morning <130 and goal 2 hours after meal <180.  Write all checks down for provider visits.   glucose blood (CONTOUR NEXT TEST) test strip To check blood sugar twice a day with goal fasting in morning <130 and goal 2  hours after meal <180.  Write all checks down for provider visits.   OZEMPIC, 0.25 OR 0.5 MG/DOSE, 2 MG/3ML SOPN Inject 0.5 mg into the skin once a week.   [DISCONTINUED] amLODipine (NORVASC) 5 MG tablet Take 1 tablet by mouth daily.   [DISCONTINUED] baclofen (LIORESAL) 10 MG tablet Take 1 tablet (10 mg total) by mouth 3 (three) times daily.   [DISCONTINUED] losartan (COZAAR) 100 MG tablet Take 1 tablet by mouth daily.   [DISCONTINUED] rosuvastatin (CRESTOR) 10 MG tablet Take 1 tablet by mouth daily.   [DISCONTINUED] traZODone (DESYREL) 50 MG tablet TAKE 1 2 TABLETS (50 100 MG TOTAL) BY MOUTH NIGHTLY   amLODipine (NORVASC) 5 MG tablet Take 1 tablet (5 mg total) by mouth daily.   losartan (COZAAR) 100 MG tablet Take 1 tablet (100 mg total) by mouth daily.   rosuvastatin (CRESTOR) 10 MG tablet Take 1 tablet (10 mg total) by mouth daily.   traZODone (DESYREL) 50 MG tablet Take 1 tablet (50 mg total) by mouth at bedtime as needed for sleep.   [DISCONTINUED] amLODipine (NORVASC) 5 MG tablet Take 5 mg by mouth daily.   [DISCONTINUED] glipiZIDE-metformin (METAGLIP) 5-500 MG tablet Take 1 tablet by mouth 2 (two) times daily with a meal. (Patient not taking: Reported on 12/20/2023)   [DISCONTINUED] losartan (COZAAR) 100 MG tablet TAKE 1 TABLET (100 MG TOTAL) BY MOUTH ONCE DAILY.   [DISCONTINUED] losartan-hydrochlorothiazide (HYZAAR) 100-12.5 MG tablet Take by mouth.   [DISCONTINUED] metFORMIN (GLUCOPHAGE-XR) 500 MG 24 hr tablet Take by mouth. (Patient not taking: Reported on 08/27/2023)   [DISCONTINUED] pioglitazone (ACTOS) 30 MG tablet Take 1 tablet by mouth daily.   [DISCONTINUED] predniSONE (STERAPRED UNI-PAK 21 TAB) 10 MG (21) TBPK tablet Take 6 tablets on day 1, 5 tablets day 2, 4 tablets day 3, 3 tablets day 4, 2 tablets day 5, 1 tablet day 6   [DISCONTINUED] Semaglutide (OZEMPIC, 2 MG/DOSE, [place]) Inject into the skin.   [DISCONTINUED] Semaglutide (RYBELSUS) 7 MG TABS TAKE 7 MG BY MOUTH ONCE DAILY DO  NOT CUT, CRUSH, OR CHEW (Patient not taking: Reported on 08/27/2023)   No facility-administered encounter medications on file as of 12/20/2023.    Past Medical History:  Diagnosis Date   Diabetes mellitus without complication (HCC)    Hyperlipidemia    Hypertension    Insomnia     Past Surgical History:  Procedure Laterality Date   COLONOSCOPY WITH PROPOFOL N/A 08/27/2023   Procedure: COLONOSCOPY WITH PROPOFOL;  Surgeon: Regis Bill, MD;  Location: [facility] ENDOSCOPY;  Service: Endoscopy;  Laterality: N/A;   WISDOM TOOTH EXTRACTION      Family History  Problem Relation Age of Onset   Leukemia Mother    Aneurysm Father    Hypertension Sister    Diabetes Sister    Hypertension Sister    Diabetes Sister    Hypertension Sister    Diabetes Sister    Sickle cell anemia Brother     Social History   Socioeconomic History   Marital status: Married    Spouse name: Not on file   Number of  children: 2   Years of education: Not on file   Highest education level: Not on file  Occupational History   Not on file  Tobacco Use   Smoking status: Never   Smokeless tobacco: Never  Vaping Use   Vaping status: Never Used  Substance and Sexual Activity   Alcohol use: No   Drug use: No   Sexual activity: Yes  Other Topics Concern   Not on file  Social History Narrative   Not on file   Social Drivers of Health   Financial Resource Strain: Low Risk  (12/20/2023)   Overall Financial Resource Strain (CARDIA)    Difficulty of Paying Living Expenses: Not hard at all  Food Insecurity: No Food Insecurity (12/20/2023)   Hunger Vital Sign    Worried About Running Out of Food in the Last Year: Never true    Ran Out of Food in the Last Year: Never true  Transportation Needs: No Transportation Needs (12/20/2023)   PRAPARE - Administrator, Civil Service (Medical): No    Lack of Transportation (Non-Medical): No  Physical Activity: Sufficiently Active (12/20/2023)   Exercise  Vital Sign    Days of Exercise per Week: 7 days    Minutes of Exercise per Session: 30 min  Stress: No Stress Concern Present (12/20/2023)   [employer] of Occupational Health - Occupational Stress Questionnaire    Feeling of Stress : Not at all  Social Connections: Moderately Isolated (12/20/2023)   Social Connection and Isolation Panel [NHANES]    Frequency of Communication with Friends and Family: Twice a week    Frequency of Social Gatherings with Friends and Family: Never    Attends Religious Services: More than 4 times per year    Active Member of [employer] or Organizations: No    Attends Banker Meetings: Never    Marital Status: Married  Catering manager Violence: Not At Risk (12/20/2023)   Humiliation, Afraid, Rape, and Kick questionnaire    Fear of Current or Ex-Partner: No    Emotionally Abused: No    Physically Abused: No    Sexually Abused: No    Review of Systems  Constitutional:  Negative for chills, diaphoresis, fever and weight loss.  Respiratory:  Negative for cough, shortness of breath and wheezing.   Cardiovascular:  Negative for chest pain, palpitations, orthopnea and leg swelling.  Neurological: Negative.   Endo/Heme/Allergies: Negative.   Psychiatric/Behavioral: Negative.        Objective    BP 126/72   Pulse 83   Temp 97.9 F (36.6 C) (Oral)   Ht 5' 7.8" (1.722 m)   Wt 221 lb 12.8 oz (100.6 kg)   SpO2 99%   BMI 33.92 kg/m   Physical Exam Vitals and nursing note reviewed.  Constitutional:      General: He is awake. He is not in acute distress.    Appearance: He is well-developed and well-groomed. He is obese. He is not ill-appearing or toxic-appearing.  HENT:     Head: Normocephalic.     Right Ear: Hearing and external ear normal.     Left Ear: Hearing and external ear normal.  Eyes:     General: Lids are normal.     Extraocular Movements: Extraocular movements intact.     Conjunctiva/sclera: Conjunctivae normal.  Neck:      Thyroid: No thyromegaly.     Vascular: No carotid bruit.  Cardiovascular:     Rate and Rhythm: Normal rate and regular  rhythm.     Heart sounds: Normal heart sounds. No murmur heard.    No gallop.  Pulmonary:     Effort: No accessory muscle usage or respiratory distress.     Breath sounds: Normal breath sounds.  Abdominal:     General: Bowel sounds are normal. There is no distension.     Palpations: Abdomen is soft.     Tenderness: There is no abdominal tenderness.  Musculoskeletal:     Cervical back: Full passive range of motion without pain.     Right lower leg: No edema.     Left lower leg: No edema.  Lymphadenopathy:     Cervical: No cervical adenopathy.  Skin:    General: Skin is warm.     Capillary Refill: Capillary refill takes less than 2 seconds.  Neurological:     Mental Status: He is alert and oriented to person, place, and time.     Deep Tendon Reflexes: Reflexes are normal and symmetric.     Reflex Scores:      Brachioradialis reflexes are 2+ on the right side and 2+ on the left side.      Patellar reflexes are 2+ on the right side and 2+ on the left side. Psychiatric:        Attention and Perception: Attention normal.        Mood and Affect: Mood normal.        Speech: Speech normal.        Behavior: Behavior normal. Behavior is cooperative.        Thought Content: Thought content normal.    Diabetic Foot Exam - Simple   Simple Foot Form Visual Inspection No deformities, no ulcerations, no other skin breakdown bilaterally: Yes Sensation Testing Intact to touch and monofilament testing bilaterally: Yes Pulse Check Posterior Tibialis and Dorsalis pulse intact bilaterally: Yes Comments     Assessment & Plan:   Problem List Items Addressed This Visit       Cardiovascular and Mediastinum   Hypertension associated with diabetes (HCC)   Chronic, stable.  BP well at goal today. Recommend she monitor BP at least a few mornings a week at home and document.   DASH diet at home.  Continue current medication regimen and adjust as needed.  Labs today: CBC, CMP, TSH, urine ALB.        Relevant Medications   OZEMPIC, 0.25 OR 0.5 MG/DOSE, 2 MG/3ML SOPN   rosuvastatin (CRESTOR) 10 MG tablet   losartan (COZAAR) 100 MG tablet   amLODipine (NORVASC) 5 MG tablet   Other Relevant Orders   CBC with Differential/Platelet   Comprehensive metabolic panel with GFR   TSH   Ambulatory referral to Ophthalmology     Respiratory   OSA on CPAP   Is not using CPAP, discussed with him.  May benefit repeat study and different equipment in the future.  For now he prefers not to pursue this.        Endocrine   Diabetes mellitus treated with injections of non-insulin medication (HCC)   Refer to diabetes with obesity plan of care.      Relevant Medications   OZEMPIC, 0.25 OR 0.5 MG/DOSE, 2 MG/3ML SOPN   rosuvastatin (CRESTOR) 10 MG tablet   losartan (COZAAR) 100 MG tablet   Other Relevant Orders   Microalbumin, Urine Waived   HgB A1c   Ambulatory referral to Ophthalmology   Hyperlipidemia associated with type 2 diabetes mellitus (HCC)   Chronic, ongoing.  Continue current  medication regimen and adjust as needed.  Lipid panel today.      Relevant Medications   OZEMPIC, 0.25 OR 0.5 MG/DOSE, 2 MG/3ML SOPN   rosuvastatin (CRESTOR) 10 MG tablet   losartan (COZAAR) 100 MG tablet   amLODipine (NORVASC) 5 MG tablet   Other Relevant Orders   Comprehensive metabolic panel with GFR   Lipid Panel w/o Chol/HDL Ratio   Ambulatory referral to Ophthalmology   Type 2 diabetes mellitus with obesity (HCC) - Primary   Chronic, ongoing.  Last A1c in July 2024 was 9.6%, will recheck today and adjust regimen as needed + check urine ALB.  Discussed with patient.  Did not tolerate Jardiance or Metformin in the past.  Does not have glucometer supplies at home, will send in supplies and instructed him on goal BS and how to use.  Suspect will need to adjust medications. - Will  increase Ozempic to 1 MG if A1c >7% on this check , could consider Glipizide in future at low dose or retrial SGLT2. - Recommend he check BS daily fasting in morning with goal <130. - Foot exam up to date. Eye exam referral placed. - Vaccinations up to date. - Statin on board and ARB.      Relevant Medications   OZEMPIC, 0.25 OR 0.5 MG/DOSE, 2 MG/3ML SOPN   rosuvastatin (CRESTOR) 10 MG tablet   losartan (COZAAR) 100 MG tablet   Other Relevant Orders   Microalbumin, Urine Waived   HgB A1c   Ambulatory referral to Ophthalmology     Other   Insomnia   Chronic, ongoing.  Suspect some related to OSA, but he prefers to not pursue another study at this time or look into new equipment.  Will continue Trazodone 50 MG as needed nightly.  Monitor closely.  Recommend: ?Maintain a regular sleep schedule, particularly a regular wake-up time in the morning ?Try not to force sleep ?Avoid caffeinated beverages after lunch ?Avoid alcohol near bedtime (eg, late afternoon and evening)  ?Avoid smoking or other nicotine intake, particularly during the evening ?Adjust the bedroom environment as needed to decrease stimuli (eg, reduce ambient light, turn off the television or radio) ?Avoid prolonged use of light-emitting screens (laptops, tablets, smartphones, ebooks) before bedtime  ?Resolve concerns or worries before bedtime ?Exercise regularly for at least 20 minutes, preferably more than four to five hours prior to bedtime  ?Avoid daytime naps, especially if they are longer than 20 to 30 minutes or occur late in the day       Obesity (BMI 30-39.9)   BMI 33.92.  Recommended eating smaller high protein, low fat meals more frequently and exercising 30 mins a day 5 times a week with a goal of 10-15lb weight loss in the next 3 months. Patient voiced their understanding and motivation to adhere to these recommendations.       Relevant Medications   OZEMPIC, 0.25 OR 0.5 MG/DOSE, 2 MG/3ML SOPN   Other  Visit Diagnoses       Benign prostatic hyperplasia without lower urinary tract symptoms       PSA check on labs today.   Relevant Orders   PSA     Encounter for screening for HIV       HIV screening today per guidelines, discussed with patient.   Relevant Orders   HIV Antibody (routine testing w rflx)     Need for hepatitis C screening test       Hep C screening today per guidelines, discussed with patient.  Relevant Orders   Hepatitis C antibody     Encounter to establish care       New patient to practice, introduced to NP role and office setting.       Return in about 3 months (around 03/20/2024) for T2DM, HTN/HLD.   Marjie Skiff, NP

## 2023-12-20 NOTE — Assessment & Plan Note (Signed)
 Chronic, ongoing.  Suspect some related to OSA, but he prefers to not pursue another study at this time or look into new equipment.  Will continue Trazodone 50 MG as needed nightly.  Monitor closely.  Recommend: ?Maintain a regular sleep schedule, particularly a regular wake-up time in the morning ?Try not to force sleep ?Avoid caffeinated beverages after lunch ?Avoid alcohol near bedtime (eg, late afternoon and evening)  ?Avoid smoking or other nicotine intake, particularly during the evening ?Adjust the bedroom environment as needed to decrease stimuli (eg, reduce ambient light, turn off the television or radio) ?Avoid prolonged use of light-emitting screens (laptops, tablets, smartphones, ebooks) before bedtime  ?Resolve concerns or worries before bedtime ?Exercise regularly for at least 20 minutes, preferably more than four to five hours prior to bedtime  ?Avoid daytime naps, especially if they are longer than 20 to 30 minutes or occur late in the day

## 2023-12-20 NOTE — Assessment & Plan Note (Signed)
BMI 33.92.  Recommended eating smaller high protein, low fat meals more frequently and exercising 30 mins a day 5 times a week with a goal of 10-15lb weight loss in the next 3 months. Patient voiced their understanding and motivation to adhere to these recommendations.

## 2023-12-20 NOTE — Assessment & Plan Note (Signed)
 Chronic, stable.  BP well at goal today. Recommend she monitor BP at least a few mornings a week at home and document.  DASH diet at home.  Continue current medication regimen and adjust as needed.  Labs today: CBC, CMP, TSH, urine ALB.

## 2023-12-20 NOTE — Assessment & Plan Note (Signed)
 Refer to diabetes with obesity plan of care.

## 2023-12-20 NOTE — Assessment & Plan Note (Signed)
 Chronic, ongoing.  Last A1c in July 2024 was 9.6%, will recheck today and adjust regimen as needed + check urine ALB.  Discussed with patient.  Did not tolerate Jardiance or Metformin in the past.  Does not have glucometer supplies at home, will send in supplies and instructed him on goal BS and how to use.  Suspect will need to adjust medications. - Will increase Ozempic to 1 MG if A1c >7% on this check , could consider Glipizide in future at low dose or retrial SGLT2. - Recommend he check BS daily fasting in morning with goal <130. - Foot exam up to date. Eye exam referral placed. - Vaccinations up to date. - Statin on board and ARB.

## 2023-12-21 LAB — LIPID PANEL W/O CHOL/HDL RATIO

## 2023-12-22 ENCOUNTER — Other Ambulatory Visit: Payer: Self-pay | Admitting: Nurse Practitioner

## 2023-12-22 LAB — HEPATITIS C ANTIBODY: Hep C Virus Ab: NONREACTIVE

## 2023-12-22 LAB — CBC WITH DIFFERENTIAL/PLATELET
Basophils Absolute: 0 10*3/uL (ref 0.0–0.2)
Basos: 0 %
EOS (ABSOLUTE): 0.3 10*3/uL (ref 0.0–0.4)
Eos: 6 %
Hematocrit: 41.9 % (ref 37.5–51.0)
Hemoglobin: 13.4 g/dL (ref 13.0–17.7)
Immature Grans (Abs): 0 10*3/uL (ref 0.0–0.1)
Immature Granulocytes: 0 %
Lymphocytes Absolute: 1.3 10*3/uL (ref 0.7–3.1)
Lymphs: 28 %
MCH: 28.9 pg (ref 26.6–33.0)
MCHC: 32 g/dL (ref 31.5–35.7)
MCV: 91 fL (ref 79–97)
Monocytes Absolute: 0.4 10*3/uL (ref 0.1–0.9)
Monocytes: 9 %
Neutrophils Absolute: 2.6 10*3/uL (ref 1.4–7.0)
Neutrophils: 57 %
Platelets: 267 10*3/uL (ref 150–450)
RBC: 4.63 x10E6/uL (ref 4.14–5.80)
RDW: 12.1 % (ref 11.6–15.4)
WBC: 4.5 10*3/uL (ref 3.4–10.8)

## 2023-12-22 LAB — COMPREHENSIVE METABOLIC PANEL WITH GFR
ALT: 17 IU/L (ref 0–44)
AST: 18 IU/L (ref 0–40)
Albumin: 4.5 g/dL (ref 3.8–4.9)
Alkaline Phosphatase: 92 IU/L (ref 44–121)
BUN/Creatinine Ratio: 16 (ref 9–20)
BUN: 14 mg/dL (ref 6–24)
Bilirubin Total: 0.7 mg/dL (ref 0.0–1.2)
CO2: 21 mmol/L (ref 20–29)
Calcium: 9.3 mg/dL (ref 8.7–10.2)
Chloride: 100 mmol/L (ref 96–106)
Creatinine, Ser: 0.85 mg/dL (ref 0.76–1.27)
Globulin, Total: 2.8 g/dL (ref 1.5–4.5)
Glucose: 312 mg/dL — ABNORMAL HIGH (ref 70–99)
Potassium: 3.9 mmol/L (ref 3.5–5.2)
Sodium: 138 mmol/L (ref 134–144)
Total Protein: 7.3 g/dL (ref 6.0–8.5)
eGFR: 101 mL/min/{1.73_m2} (ref >59)

## 2023-12-22 LAB — HIV ANTIBODY (ROUTINE TESTING W REFLEX): HIV Screen 4th Generation wRfx: NONREACTIVE

## 2023-12-22 LAB — HEMOGLOBIN A1C
Est. average glucose Bld gHb Est-mCnc: 197 mg/dL
Hgb A1c MFr Bld: 8.5 % — ABNORMAL HIGH (ref 4.8–5.6)

## 2023-12-22 LAB — PSA: Prostate Specific Ag, Serum: 0.2 ng/mL (ref 0.0–4.0)

## 2023-12-22 LAB — LIPID PANEL W/O CHOL/HDL RATIO
Cholesterol, Total: 119 mg/dL (ref 100–199)
HDL: 46 mg/dL (ref >39)
LDL Chol Calc (NIH): 55 mg/dL (ref 0–99)
Triglycerides: 94 mg/dL (ref 0–149)
VLDL Cholesterol Cal: 18 mg/dL (ref 5–40)

## 2023-12-22 LAB — TSH: TSH: 2.17 u[IU]/mL (ref 0.450–4.500)

## 2023-12-22 MED ORDER — SEMAGLUTIDE (1 MG/DOSE) 4 MG/3ML ~~LOC~~ SOPN
1.0000 mg | PEN_INJECTOR | SUBCUTANEOUS | 3 refills | Status: DC
Start: 2023-12-22 — End: 2024-03-26

## 2023-12-22 NOTE — Progress Notes (Signed)
 Good morning please let Mclane know his labs have returned: - Kidney and liver function are nice and normal + lipid panel is showing levels at goal.  Continue Rosuvastatin, Losartan, and Amlodipine. - A1c is coming down from previous 9.6%, but is still elevated above goal at 8.5%.  We want to see this under 7%.  I am going to increase your Ozempic to 1 MG weekly, stop current 0.5 MG dosing and start new pens I am sending in.  I would like to see you back in 4 weeks to ensure you are tolerating new dose.  My staff will help you schedule this. (Please schedule 4 week follow-up crew) - Hep C and HIV are negative. - CBC shows no anemia or infection. - Thyroid and prostate levels are normal.  Overall labs are stable with exception of A1c which we will work on.  Any questions? Keep being amazing!!  Thank you for allowing me to participate in your care.  I appreciate you. Kindest regards, Zonia Caplin

## 2023-12-25 ENCOUNTER — Ambulatory Visit: Payer: Self-pay

## 2023-12-25 NOTE — Telephone Encounter (Signed)
 Pt given lab results per notes of J. Cannady, NP from 12/22/23 on 12/25/23. Pt verbalized understanding. F/u appt scheduled for 01/22/24.  Please advise regarding food choices for DM.

## 2023-12-25 NOTE — Telephone Encounter (Signed)
 1st attempt, LVM requesting pt return call  Copied from CRM [phone] [phone]. Topic: General - Other >> Dec 25, 2023 12:58 PM Marissa P wrote: Reason for CRM: Patient is calling for his lab results, he said he had it last Friday and would like someone to follow up with him please because he was told to call in for the results. # [id number]. Thank you

## 2024-01-18 NOTE — Patient Instructions (Signed)
Be Involved in Caring For Your Health:  Taking Medications When medications are taken as directed, they can greatly improve your health. But if they are not taken as prescribed, they may not work. In some cases, not taking them correctly can be harmful. To help ensure your treatment remains effective and safe, understand your medications and how to take them. Bring your medications to each visit for review by your provider.  Your lab results, notes, and after visit summary will be available on My Chart. We strongly encourage you to use this feature. If lab results are abnormal the clinic will contact you with the appropriate steps. If the clinic does not contact you assume the results are satisfactory. You can always view your results on My Chart. If you have questions regarding your health or results, please contact the clinic during office hours. You can also ask questions on My Chart.  We at [facility] are grateful that you chose [place] to provide your care. We strive to provide evidence-based and compassionate care and are always looking for feedback. If you get a survey from the clinic please complete this so we can hear your opinions.  Diabetes Mellitus and Exercise Regular exercise is important for your health, especially if you have diabetes mellitus. Exercise is not just about losing weight. It can also help you increase muscle strength and bone density and reduce body fat and stress. This can help your level of endurance and make you more fit and flexible. Why should I exercise if I have diabetes? Exercise has many benefits for people with diabetes. It can: Help lower and control your blood sugar (glucose). Help your body respond better and become more sensitive to the hormone insulin. Reduce how much insulin your body needs. Lower your risk for heart disease by: Lowering how much "bad" cholesterol and triglycerides you have in your body. Increasing how much "good" cholesterol  you have in your body. Lowering your blood pressure. Lowering your blood glucose levels. What is my activity plan? Your health care provider or an expert trained in diabetes care (certified diabetes educator) can help you make an activity plan. This plan can help you find the type of exercise that works for you. It may also tell you how often to exercise and for how long. Be sure to: Get at least 150 minutes of medium-intensity or high-intensity exercise each week. This may involve brisk walking, biking, or water aerobics. Do stretching and strengthening exercises at least 2 times a week. This may involve yoga or weight lifting. Spread out your activity over at least 3 days of the week. Get some form of physical activity each day. Do not go more than 2 days in a row without some kind of activity. Avoid being inactive for more than 30 minutes at a time. Take frequent breaks to walk or stretch. Choose activities that you enjoy. Set goals that you know you can accomplish. Start slowly and increase the intensity of your exercise over time. How do I manage my diabetes during exercise?  Monitor your blood glucose Check your blood glucose before and after you exercise. If your blood glucose is 240 mg/dL (40.9 mmol/L) or higher before you exercise, check your urine for ketones. These are chemicals created by the liver. If you have ketones in your urine, do not exercise until your blood glucose returns to normal. If your blood glucose is 100 mg/dL (5.6 mmol/L) or lower, eat a snack that has 15-20 grams of carbohydrate in  it. Check your blood glucose 15 minutes after the snack to make sure that your level is above 100 mg/dL (5.6 mmol/L) before you start to exercise. Your risk for low blood glucose (hypoglycemia) goes up during and after exercise. Know the symptoms of this condition and how to treat it. Follow these instructions at home: Keep a carbohydrate snack on hand for use before, during, and after  exercise. This can help prevent or treat hypoglycemia. Avoid injecting insulin into parts of your body that are going to be used during exercise. This may include: Your arms, when you are going to play tennis. Your legs, when you are about to go jogging. Keep track of your exercise habits. This can help you and your health care provider watch and adjust your activity plan. Write down: What you eat before and after you exercise. Blood glucose levels before and after you exercise. The type and amount of exercise you do. Talk to your health care provider before you start a new activity. They may need to: Make sure that the activity is safe for you. Adjust your insulin, other medicines, and food that you eat. Drink water while you exercise. This can stop you from losing too much water (dehydration). It can also prevent problems caused by having a lot of heat in your body (heat stroke). Where to find more information American Diabetes Association: diabetes.org Association of Diabetes Care & Education Specialists: diabeteseducator.org This information is not intended to replace advice given to you by your health care provider. Make sure you discuss any questions you have with your health care provider. Document Revised: 02/14/2022 Document Reviewed: 02/14/2022 Elsevier Patient Education  2024 [employer].

## 2024-01-22 ENCOUNTER — Encounter: Payer: Self-pay | Admitting: Nurse Practitioner

## 2024-01-22 ENCOUNTER — Other Ambulatory Visit: Payer: Self-pay

## 2024-01-22 ENCOUNTER — Ambulatory Visit: Admitting: Nurse Practitioner

## 2024-01-22 ENCOUNTER — Telehealth: Payer: Self-pay | Admitting: Nurse Practitioner

## 2024-01-22 VITALS — BP 122/74 | HR 84 | Temp 97.8°F | Ht 67.8 in | Wt 218.0 lb

## 2024-01-22 DIAGNOSIS — E669 Obesity, unspecified: Secondary | ICD-10-CM

## 2024-01-22 DIAGNOSIS — E1169 Type 2 diabetes mellitus with other specified complication: Secondary | ICD-10-CM

## 2024-01-22 DIAGNOSIS — Z7985 Long-term (current) use of injectable non-insulin antidiabetic drugs: Secondary | ICD-10-CM

## 2024-01-22 MED ORDER — MICROLET LANCETS MISC: 1.0000 | Freq: Every day | 3 refills | Status: AC

## 2024-01-22 NOTE — Telephone Encounter (Signed)
 Patient dropped off document FMLA, to be filled out by provider. Patient requested to send it back via Call Patient to pick up within 5-days. Document is located in providers folder. Please advise at Mobile [phone] (mobile)

## 2024-01-22 NOTE — Assessment & Plan Note (Signed)
 Chronic, ongoing.  Last A1c April was 8.5%, we went up on Ozempic  to 1 MG which he is tolerating.  Did not tolerate Jardiance or Metformin in the past.  Will see what strips he needs for glucometer and send in.  Provided him with sheet to document sugars on with goals at top for sugar levels. Urine ALB 18 Jan 2024. - Continue Ozempic  1 MG with goal A1c <7% , could consider Glipizide in future at low dose or retrial SGLT2. - Recommend he check BS daily fasting in morning with goal <130. - Foot exam up to date. Eye exam referral placed last visit. - Vaccinations up to date. - Statin on board and ARB.

## 2024-01-22 NOTE — Telephone Encounter (Signed)
 Paperwork received. Will start and then give to provider to complete.

## 2024-01-22 NOTE — Progress Notes (Signed)
 BP 122/74 (BP Location: Left Arm, Patient Position: Sitting, Cuff Size: Normal)   Pulse 84   Temp 97.8 F (36.6 C) (Oral)   Ht 5' 7.8" (1.722 m)   Wt 218 lb (98.9 kg)   SpO2 96%   BMI 33.34 kg/m    Subjective:    Patient ID: Kenneth Cooper, male    DOB: Jan 10, 1966, 58 y.o.   MRN: [id number]  HPI: Kenneth Cooper is a 58 y.o. male  Chief Complaint  Patient presents with   Diabetes    No recent eye exam per patient    DIABETES Diagnosed many years ago.  At visit on 12/20/23 we increased Ozempic  to 1 MG which he is tolerating.  Has been losing weight and has decreased appetite.  A1c in April was 8.5%. Walking an hour a day, his wife is strict on this.  Did not tolerate Jardiance, made him very tired. Metformin caused him to feel tired and did not feel good. Took Actos and Glipizide in past, does not recall these. Hypoglycemic episodes:no Polydipsia/polyuria: no Visual disturbance: no Chest pain: no Paresthesias: no Glucose Monitoring: no  Accucheck frequency: Not Checking - just got the meter  Fasting glucose:   Post prandial:  Evening:  Before meals: Taking Insulin?: no  Long acting insulin:  Short acting insulin: Blood Pressure Monitoring: not checking Retinal Examination: Not up to Date -- [facility] Foot Exam: Not up to Date Diabetic Education: Not Completed Pneumovax: Up to Date Influenza: Up to Date Aspirin: no   Relevant past medical, surgical, family and social history reviewed and updated as indicated. Interim medical history since our last visit reviewed. Allergies and medications reviewed and updated.  Review of Systems  Constitutional:  Negative for activity change, diaphoresis, fatigue and fever.  Respiratory:  Negative for cough, chest tightness, shortness of breath and wheezing.   Cardiovascular:  Negative for chest pain, palpitations and leg swelling.  Gastrointestinal: Negative.   Endocrine: Negative for polydipsia, polyphagia and polyuria.   Neurological: Negative.   Psychiatric/Behavioral: Negative.     Per HPI unless specifically indicated above     Objective:     BP 122/74 (BP Location: Left Arm, Patient Position: Sitting, Cuff Size: Normal)   Pulse 84   Temp 97.8 F (36.6 C) (Oral)   Ht 5' 7.8" (1.722 m)   Wt 218 lb (98.9 kg)   SpO2 96%   BMI 33.34 kg/m   Wt Readings from Last 3 Encounters:  01/22/24 218 lb (98.9 kg)  12/20/23 221 lb 12.8 oz (100.6 kg)  08/27/23 217 lb (98.4 kg)    Physical Exam Vitals and nursing note reviewed.  Constitutional:      General: He is awake. He is not in acute distress.    Appearance: He is well-developed and well-groomed. He is obese. He is not ill-appearing or toxic-appearing.  HENT:     Head: Normocephalic.     Right Ear: Hearing and external ear normal.     Left Ear: Hearing and external ear normal.  Eyes:     General: Lids are normal.     Extraocular Movements: Extraocular movements intact.     Conjunctiva/sclera: Conjunctivae normal.  Neck:     Thyroid: No thyromegaly.     Vascular: No carotid bruit.  Cardiovascular:     Rate and Rhythm: Normal rate and regular rhythm.     Heart sounds: Normal heart sounds. No murmur heard.    No gallop.  Pulmonary:  Effort: No accessory muscle usage or respiratory distress.     Breath sounds: Normal breath sounds.  Abdominal:     General: Bowel sounds are normal. There is no distension.     Palpations: Abdomen is soft.     Tenderness: There is no abdominal tenderness.  Musculoskeletal:     Cervical back: Full passive range of motion without pain.     Right lower leg: No edema.     Left lower leg: No edema.  Lymphadenopathy:     Cervical: No cervical adenopathy.  Skin:    General: Skin is warm.     Capillary Refill: Capillary refill takes less than 2 seconds.  Neurological:     Mental Status: He is alert and oriented to person, place, and time.     Deep Tendon Reflexes: Reflexes are normal and symmetric.      Reflex Scores:      Brachioradialis reflexes are 2+ on the right side and 2+ on the left side.      Patellar reflexes are 2+ on the right side and 2+ on the left side. Psychiatric:        Attention and Perception: Attention normal.        Mood and Affect: Mood normal.        Speech: Speech normal.        Behavior: Behavior normal. Behavior is cooperative.        Thought Content: Thought content normal.    Results for orders placed or performed in visit on 12/24/23  HM DIABETES EYE EXAM   Collection Time: 04/14/21 10:17 AM  Result Value Ref Range   [facility] Diabetic Eye Exam No Retinopathy No Retinopathy      Assessment & Plan:   Problem List Items Addressed This Visit       Endocrine   Type 2 diabetes mellitus with obesity (HCC) - Primary   Chronic, ongoing.  Last A1c April was 8.5%, we went up on Ozempic  to 1 MG which he is tolerating.  Did not tolerate Jardiance or Metformin in the past.  Will see what strips he needs for glucometer and send in.  Provided him with sheet to document sugars on with goals at top for sugar levels. Urine ALB 18 Jan 2024. - Continue Ozempic  1 MG with goal A1c <7% , could consider Glipizide in future at low dose or retrial SGLT2. - Recommend he check BS daily fasting in morning with goal <130. - Foot exam up to date. Eye exam referral placed last visit. - Vaccinations up to date. - Statin on board and ARB.        Follow up plan: Return in about 2 months (around 03/23/2024) for T2DM, HTN/HLD.

## 2024-01-24 NOTE — Telephone Encounter (Signed)
 Paperwork given to provider to complete and sign

## 2024-01-27 NOTE — Telephone Encounter (Signed)
 Paperwork completed. Called and LVM notifying patient that his forms were ready to be picked up.

## 2024-02-25 ENCOUNTER — Other Ambulatory Visit: Payer: Self-pay

## 2024-02-25 ENCOUNTER — Emergency Department

## 2024-02-25 ENCOUNTER — Emergency Department
Admission: EM | Admit: 2024-02-25 | Discharge: 2024-02-25 | Disposition: A | Discharge status: Home / Self Care | Attending: Emergency Medicine | Admitting: Emergency Medicine

## 2024-02-25 DIAGNOSIS — I10 Essential (primary) hypertension: Secondary | ICD-10-CM | POA: Diagnosis not present

## 2024-02-25 DIAGNOSIS — R1084 Generalized abdominal pain: Secondary | ICD-10-CM | POA: Diagnosis not present

## 2024-02-25 DIAGNOSIS — E119 Type 2 diabetes mellitus without complications: Secondary | ICD-10-CM | POA: Insufficient documentation

## 2024-02-25 DIAGNOSIS — R109 Unspecified abdominal pain: Secondary | ICD-10-CM | POA: Diagnosis not present

## 2024-02-25 DIAGNOSIS — M549 Dorsalgia, unspecified: Secondary | ICD-10-CM | POA: Diagnosis not present

## 2024-02-25 DIAGNOSIS — R112 Nausea with vomiting, unspecified: Secondary | ICD-10-CM | POA: Insufficient documentation

## 2024-02-25 DIAGNOSIS — R1010 Upper abdominal pain, unspecified: Secondary | ICD-10-CM | POA: Diagnosis not present

## 2024-02-25 DIAGNOSIS — R1011 Right upper quadrant pain: Secondary | ICD-10-CM | POA: Diagnosis not present

## 2024-02-25 DIAGNOSIS — K5730 Diverticulosis of large intestine without perforation or abscess without bleeding: Secondary | ICD-10-CM | POA: Diagnosis not present

## 2024-02-25 LAB — URINALYSIS, ROUTINE W REFLEX MICROSCOPIC
Bacteria, UA: NONE SEEN
Bilirubin Urine: NEGATIVE
Glucose, UA: >=500 mg/dL — AB
Hgb urine dipstick: NEGATIVE
Ketones, ur: 20 mg/dL — AB
Leukocytes,Ua: NEGATIVE
Nitrite: NEGATIVE
Protein, ur: NEGATIVE mg/dL
RBC / HPF: 0 RBC/hpf (ref 0–5)
Specific Gravity, Urine: 1.024 (ref 1.005–1.030)
Squamous Epithelial / HPF: 0 /HPF (ref 0–5)
pH: 6 (ref 5.0–8.0)

## 2024-02-25 LAB — CBC WITH DIFFERENTIAL/PLATELET
Abs Immature Granulocytes: 0.01 10*3/uL (ref 0.00–0.07)
Basophils Absolute: 0 10*3/uL (ref 0.0–0.1)
Basophils Relative: 0 %
Eosinophils Absolute: 0 10*3/uL (ref 0.0–0.5)
Eosinophils Relative: 1 %
HCT: 41.7 % (ref 39.0–52.0)
Hemoglobin: 13.4 g/dL (ref 13.0–17.0)
Immature Granulocytes: 0 %
Lymphocytes Relative: 19 %
Lymphs Abs: 0.9 10*3/uL (ref 0.7–4.0)
MCH: 27.9 pg (ref 26.0–34.0)
MCHC: 32.1 g/dL (ref 30.0–36.0)
MCV: 86.7 fL (ref 80.0–100.0)
Monocytes Absolute: 0.2 10*3/uL (ref 0.1–1.0)
Monocytes Relative: 5 %
Neutro Abs: 3.6 10*3/uL (ref 1.7–7.7)
Neutrophils Relative %: 75 %
Platelets: 268 10*3/uL (ref 150–400)
RBC: 4.81 MIL/uL (ref 4.22–5.81)
RDW: 12.2 % (ref 11.5–15.5)
WBC: 4.8 10*3/uL (ref 4.0–10.5)
nRBC: 0 % (ref 0.0–0.2)

## 2024-02-25 LAB — COMPREHENSIVE METABOLIC PANEL WITH GFR
ALT: 20 U/L (ref 0–44)
AST: 21 U/L (ref 15–41)
Albumin: 4.2 g/dL (ref 3.5–5.0)
Alkaline Phosphatase: 64 U/L (ref 38–126)
Anion gap: 10 (ref 5–15)
BUN: 16 mg/dL (ref 6–20)
CO2: 25 mmol/L (ref 22–32)
Calcium: 9.1 mg/dL (ref 8.9–10.3)
Chloride: 100 mmol/L (ref 98–111)
Creatinine, Ser: 0.71 mg/dL (ref 0.61–1.24)
GFR, Estimated: >60 mL/min (ref >60)
Glucose, Bld: 287 mg/dL — ABNORMAL HIGH (ref 70–99)
Potassium: 3.7 mmol/L (ref 3.5–5.1)
Sodium: 135 mmol/L (ref 135–145)
Total Bilirubin: 1.5 mg/dL — ABNORMAL HIGH (ref 0.0–1.2)
Total Protein: 7.8 g/dL (ref 6.5–8.1)

## 2024-02-25 LAB — LIPASE, BLOOD: Lipase: 35 U/L (ref 11–51)

## 2024-02-25 MED ORDER — KETOROLAC TROMETHAMINE 15 MG/ML IJ SOLN
15.0000 mg | Freq: Once | INTRAMUSCULAR | Status: AC
  Administered 2024-02-25: 15 mg via INTRAVENOUS
  Filled 2024-02-25: qty 1

## 2024-02-25 MED ORDER — LACTATED RINGERS IV BOLUS
1000.0000 mL | Freq: Once | INTRAVENOUS | Status: AC
  Administered 2024-02-25: 1000 mL via INTRAVENOUS

## 2024-02-25 MED ORDER — ONDANSETRON HCL 4 MG/2ML IJ SOLN
4.0000 mg | Freq: Once | INTRAMUSCULAR | Status: AC
  Administered 2024-02-25: 4 mg via INTRAVENOUS
  Filled 2024-02-25: qty 2

## 2024-02-25 MED ORDER — OXYCODONE-ACETAMINOPHEN 5-325 MG PO TABS
1.0000 | ORAL_TABLET | Freq: Once | ORAL | Status: AC
  Administered 2024-02-25: 1 via ORAL
  Filled 2024-02-25: qty 1

## 2024-02-25 MED ORDER — MORPHINE SULFATE (PF) 4 MG/ML IV SOLN
4.0000 mg | Freq: Once | INTRAVENOUS | Status: AC
  Administered 2024-02-25: 4 mg via INTRAVENOUS
  Filled 2024-02-25: qty 1

## 2024-02-25 MED ORDER — CYCLOBENZAPRINE HCL 5 MG PO TABS: 5.0000 mg | ORAL_TABLET | Freq: Three times a day (TID) | ORAL | 0 refills | Status: DC | PRN

## 2024-02-25 MED ORDER — LIDOCAINE 5 % EX PTCH: 1.0000 | MEDICATED_PATCH | Freq: Two times a day (BID) | CUTANEOUS | 0 refills | Status: DC

## 2024-02-25 MED ORDER — LIDOCAINE 5 % EX PTCH
1.0000 | MEDICATED_PATCH | CUTANEOUS | Status: DC
  Administered 2024-02-25: 1 via TRANSDERMAL
  Filled 2024-02-25: qty 1

## 2024-02-25 NOTE — ED Provider Notes (Signed)
 [facility] Provider Note    Event Date/Time   First MD Initiated Contact with Patient 02/25/24 1318     (approximate)   History   Abdominal Pain   HPI Kenneth Cooper is a 58 y.o. male with history of HTN, HLD, DM2 presenting today for abdominal pain.  Patient states earlier this morning had acute onset of right flank pain associated with nausea and vomiting.  Denies any dysuria, hematuria, chest pain, shortness of breath, fever, chills.  Denies history of kidney stones or other prior abdominal surgeries.  Has not taken any medication for the pain.     Physical Exam   Triage Vital Signs: ED Triage Vitals  Encounter Vitals Group     BP 02/25/24 1314 (!) 163/99     Girls Systolic BP Percentile --      Girls Diastolic BP Percentile --      Boys Systolic BP Percentile --      Boys Diastolic BP Percentile --      Pulse Rate 02/25/24 1314 80     Resp 02/25/24 1314 19     Temp 02/25/24 1314 98 F (36.7 C)     Temp src --      SpO2 02/25/24 1314 100 %     Weight 02/25/24 1313 216 lb (98 kg)     Height 02/25/24 1313 5' 6 (1.676 m)     Head Circumference --      Peak Flow --      Pain Score 02/25/24 1313 10     Pain Loc --      Pain Education --      Exclude from Growth Chart --     Most recent vital signs: Vitals:   02/25/24 1314  BP: (!) 163/99  Pulse: 80  Resp: 19  Temp: 98 F (36.7 C)  SpO2: 100%   Physical Exam: I have reviewed the vital signs and nursing notes. General: Awake, alert, in obvious pain and moving a lot in bed with uncomfortable appearing Head:  Atraumatic, normocephalic.   ENT:  EOM intact, PERRL. Oral mucosa is pink and moist with no lesions. Neck: Neck is supple with full range of motion, No meningeal signs. Cardiovascular:  RRR, No murmurs. Peripheral pulses palpable and equal bilaterally. Respiratory:  Symmetrical chest wall expansion.  No rhonchi, rales, or wheezes.  Good air movement throughout.  No use of  accessory muscles.   Musculoskeletal:  No cyanosis or edema. Moving extremities with full ROM Abdomen:  Soft, nontender, nondistended.  Notable right-sided CVA tenderness to palpation. Neuro:  GCS 15, moving all four extremities, interacting appropriately. Speech clear. Psych:  Calm, appropriate.   Skin:  Warm, dry, no rash.    ED Results / Procedures / Treatments   Labs (all labs ordered are listed, but only abnormal results are displayed) Labs Reviewed  COMPREHENSIVE METABOLIC PANEL WITH GFR - Abnormal; Notable for the following components:      Result Value   Glucose, Bld 287 (*)    Total Bilirubin 1.5 (*)    All other components within normal limits  CBC WITH DIFFERENTIAL/PLATELET  LIPASE, BLOOD  URINALYSIS, ROUTINE W REFLEX MICROSCOPIC     EKG    RADIOLOGY CT pending at time of signout   PROCEDURES:  Critical Care performed: No  Procedures   MEDICATIONS ORDERED IN ED: Medications  ketorolac (TORADOL) 15 MG/ML injection 15 mg (15 mg Intravenous Given 02/25/24 1353)  ondansetron (ZOFRAN) injection 4 mg (4 mg  Intravenous Given 02/25/24 1353)  lactated ringers bolus 1,000 mL (1,000 mLs Intravenous New Bag/Given 02/25/24 1355)  morphine (PF) 4 MG/ML injection 4 mg (4 mg Intravenous Given 02/25/24 1425)     IMPRESSION / MDM / ASSESSMENT AND PLAN / ED COURSE  I reviewed the triage vital signs and the nursing notes.                              Differential diagnosis includes, but is not limited to, nephrolithiasis, pyelonephritis, lower suspicion for cholecystitis or appendicitis  Patient's presentation is most consistent with acute complicated illness / injury requiring diagnostic workup.  Patient is a 58 year old male presenting today for acute onset right flank pain associate with nausea and vomiting.  Is uncomfortable appearing and rolling around the bed.  Has right-sided CVA tenderness but no other focal tenderness to the anterior abdomen.  Highest concern for  nephrolithiasis will get CT renal stone imaging for further evaluation.  Patient given Toradol and Zofran and 1 L fluids for symptomatic management.  Laboratory workup with unremarkable CBC and lipase.  CMP with mild hyperglycemia and slightly elevated T. bili at 1.5 although this may also just be from vomiting.  Reassessed with some ongoing pain symptoms and given IV morphine.  Patient signed out pending results of CT scan and reassessment.  Patient was feeling significantly better following IV morphine.  Clinical Course as of 02/25/24 1456  Tue Feb 25, 2024  1456 Reassessed following morphine dose and now feeling significantly better and resting in bed with no acute distress [DW]    Clinical Course User Index [DW] Kandee Orion, MD     FINAL CLINICAL IMPRESSION(S) / ED DIAGNOSES   Final diagnoses:  Right flank pain  Nausea and vomiting, unspecified vomiting type     Rx / DC Orders   ED Discharge Orders     None        Note:  This document was prepared using Dragon voice recognition software and may include unintentional dictation errors.   Kandee Orion, MD 02/25/24 [phone]

## 2024-02-25 NOTE — ED Triage Notes (Signed)
 Pt comes with RUQ pain since 5am. Pt states no trouble with urination. Pt states N/V

## 2024-02-25 NOTE — ED Provider Notes (Signed)
-----------------------------------------   3:10 PM on 02/25/2024 -----------------------------------------  Blood pressure (!) 163/99, pulse 80, temperature 98 F (36.7 C), resp. rate 19, height 5' 6 (1.676 m), weight 98 kg, SpO2 100%.  Assuming care from Dr. Karlynn Oyster.  In short, Kenneth Cooper is a 58 y.o. male with a chief complaint of Abdominal Pain .  Refer to the original H&P for additional details.  The current plan of care is to follow-up CT abdomen/pelvis results.  ----------------------------------------- 4:53 PM on 02/25/2024 ----------------------------------------- CT abdomen/pelvis is negative for acute process, urinalysis with no signs of infection.  On reassessment, pain is primarily with twisting movement and is reproducible with palpation along his right flank and lower chest wall.  Suspect musculoskeletal etiology, patient appropriate for outpatient management with Lidoderm  patch, muscle relaxant, and NSAIDs.  He was counseled to follow-up with his PCP and to return to the ED for new or worsening symptoms.  Patient agrees with plan.   Twilla Galea, MD 02/25/24 1655

## 2024-02-25 NOTE — ED Triage Notes (Signed)
 ARrives from home via [facility]  C/O RUQ abd pain radiating to back. Onset this AM at 0500.  Also c/o vomiting x 5 times today.  VS wnl.  147/88 100% RA 77

## 2024-02-25 NOTE — ED Notes (Signed)
 Pt is CAOx4, breathing normally, and normal in color. Pt is complaining of right sided back/flank pain that came on suddenly this morning. Pt rates the pain at a 10/10 and is in obvious distress. Pt denies any trauma. Pt lying in bed at this time and wife is at bedside.

## 2024-02-27 ENCOUNTER — Encounter: Payer: Self-pay | Admitting: Nurse Practitioner

## 2024-02-27 DIAGNOSIS — R109 Unspecified abdominal pain: Secondary | ICD-10-CM | POA: Diagnosis not present

## 2024-02-27 DIAGNOSIS — R112 Nausea with vomiting, unspecified: Secondary | ICD-10-CM | POA: Diagnosis not present

## 2024-03-03 ENCOUNTER — Encounter: Payer: Self-pay | Admitting: Nurse Practitioner

## 2024-03-03 ENCOUNTER — Ambulatory Visit: Admitting: Nurse Practitioner

## 2024-03-03 VITALS — BP 130/68 | HR 84 | Temp 98.1°F | Ht 67.8 in | Wt 217.0 lb

## 2024-03-03 DIAGNOSIS — M5441 Lumbago with sciatica, right side: Secondary | ICD-10-CM | POA: Diagnosis not present

## 2024-03-03 MED ORDER — LIDOCAINE 5 % EX PTCH: 1.0000 | MEDICATED_PATCH | CUTANEOUS | 0 refills | Status: DC

## 2024-03-03 MED ORDER — OXYCODONE HCL 5 MG PO TABS: 5.0000 mg | ORAL_TABLET | Freq: Four times a day (QID) | ORAL | 0 refills | Status: AC | PRN

## 2024-03-03 MED ORDER — CYCLOBENZAPRINE HCL 5 MG PO TABS: 5.0000 mg | ORAL_TABLET | Freq: Three times a day (TID) | ORAL | 0 refills | Status: DC | PRN

## 2024-03-03 NOTE — Patient Instructions (Signed)
 Chronic Back Pain Chronic back pain is back pain that lasts longer than 3 months. The pain may get worse at certain times (flare-ups). There are things you can do at home to manage your pain. Follow these instructions at home: Watch for any changes in your symptoms. Take these actions to help with your pain: Managing pain and stiffness     If told, put ice on the painful area. You may be told to use ice for 24-48 hours after a flare-up starts. Put ice in a plastic bag. Place a towel between your skin and the bag. Leave the ice on for 20 minutes, 2-3 times a day. If told, put heat on the painful area. Do this as often as told by your doctor. Use the heat source that your doctor recommends, such as a moist heat pack or a heating pad. Place a towel between your skin and the heat source. Leave the heat on for 20-30 minutes. If your skin turns bright red, take off the ice or heat right away to prevent skin damage. The risk of damage is higher if you cannot feel pain, heat, or cold. Soak in a warm bath. This can help with pain. Activity        Avoid bending and other activities that make the pain worse. When you stand: Keep your upper back and neck straight. Keep your shoulders pulled back. Avoid slouching. When you sit: Keep your back straight. Relax your shoulders. Do not round your shoulders or pull them backward. Do not sit or stand in one place for too long. Take short rest breaks during the day. Lying down or standing is often better than sitting. Resting can help relieve pain. When sitting or lying down for a long time, do some mild activity or stretching. This will help to prevent stiffness and pain. Get regular exercise. Ask your doctor what activities are safe for you. You may have to avoid lifting. Ask your provider how much you can safely lift. If you lift things: Bend your knees. Keep the weight close to your body. Avoid twisting. Medicines Take over-the-counter and  prescription medicines only as told by your doctor. You may need to take medicines for pain and swelling. These may be taken by mouth or put on the skin. You may also be given muscle relaxants. Ask your doctor if the medicine prescribed to you: Requires you to avoid driving or using machinery. Can cause trouble pooping (constipation). You may need to take these actions to prevent or treat trouble pooping: Drink enough fluid to keep your pee (urine) pale yellow. Take over-the-counter or prescription medicines. Eat foods that are high in fiber. These include beans, whole grains, and fresh fruits and vegetables. Limit foods that are high in fat and sugars. These include fried or sweet foods. General instructions  Sleep on a firm mattress. Try lying on your side with your knees slightly bent. If you lie on your back, put a pillow under your knees. Do not smoke or use any products that contain nicotine or tobacco. If you need help quitting, ask your doctor. Contact a doctor if: Your pain does not get better with rest or medicine. You have new pain. You have a fever. You lose weight quickly. You have trouble doing your normal activities. One or both of your legs or feet feel weak. One or both of your legs or feet lose feeling (have numbness). Get help right away if: You are not able to control when you pee  or poop. You have bad back pain and: You feel like you may vomit (nauseous). You vomit. You have pain in your chest or your belly (abdomen). You have shortness of breath. You faint. These symptoms may be an emergency. Get help right away. Call 911. Do not wait to see if the symptoms will go away. Do not drive yourself to the hospital. This information is not intended to replace advice given to you by your health care provider. Make sure you discuss any questions you have with your health care provider. Document Revised: 04/16/2022 Document Reviewed: 04/16/2022 Elsevier Patient Education   2024 [employer].

## 2024-03-03 NOTE — Progress Notes (Signed)
 BP 130/68 (BP Location: Left Arm)   Pulse 84   Temp 98.1 F (36.7 C) (Oral)   Ht 5' 7.8 (1.722 m)   Wt 217 lb (98.4 kg)   SpO2 98%   BMI 33.19 kg/m    Subjective:    Patient ID: Kenneth Cooper, male    DOB: 1965/09/20, 58 y.o.   MRN: [id number]  HPI: Kenneth Cooper is a 59 y.o. male  Chief Complaint  Patient presents with   ER Follow Up    Patient states he was seen at the ER twice recently, once at [facility] and once at [facility]. States he has been having R side and back pain, currently rating pain at a 7.    ER FOLLOW UP Seen at [facility] on 02/25/24 and then at [facility] on 02/27/24. Having lower right back pain that started when he got off work on Monday morning, 02/24/24.  Was doing a lot of lifting at work prior to this starting, at baseline does a lot of heavy lifting at work. CT scan in ER did not show any kidney stones.  Did note aortic atherosclerosis. No x-rays were performed of lower back.  No recent imaging noted of lower back.  At [facility] was given steroid taper, Oxycodone , and Zofran .  Pain today is 7/10, remains painful to right lower back.  Went back to work last night and started lifting. No further nausea and able to move.  Oxycodone  is offering some benefit and he continues steroid taper.  Also taking muscle relaxer and Ibuprofen for pain.    He does have history of injury to the right side at work years ago, this took awhile to heal. Time since discharge:  5 days Hospital/facility: as above Diagnosis: Right Flank Pain Procedures/tests: imaging Consultants: none New medications: as above Discharge instructions:  follow-up with PCP Status: fluctuating   Relevant past medical, surgical, family and social history reviewed and updated as indicated. Interim medical history since our last visit reviewed. Allergies and medications reviewed and updated.  Review of Systems  Constitutional:  Negative for activity change, diaphoresis, fatigue and fever.  Respiratory:  Negative for  cough, chest tightness, shortness of breath and wheezing.   Cardiovascular:  Negative for chest pain, palpitations and leg swelling.  Musculoskeletal:  Positive for back pain.  Neurological: Negative.   Psychiatric/Behavioral: Negative.      Per HPI unless specifically indicated above     Objective:    BP 130/68 (BP Location: Left Arm)   Pulse 84   Temp 98.1 F (36.7 C) (Oral)   Ht 5' 7.8 (1.722 m)   Wt 217 lb (98.4 kg)   SpO2 98%   BMI 33.19 kg/m   Wt Readings from Last 3 Encounters:  03/03/24 217 lb (98.4 kg)  02/25/24 216 lb (98 kg)  01/22/24 218 lb (98.9 kg)    Physical Exam Vitals and nursing note reviewed.  Constitutional:      General: He is awake. He is not in acute distress.    Appearance: He is well-developed and well-groomed. He is obese. He is not ill-appearing or toxic-appearing.  HENT:     Head: Normocephalic.     Right Ear: Hearing and external ear normal.     Left Ear: Hearing and external ear normal.   Eyes:     General: Lids are normal.     Extraocular Movements: Extraocular movements intact.     Conjunctiva/sclera: Conjunctivae normal.   Neck:     Thyroid: No  thyromegaly.     Vascular: No carotid bruit.   Cardiovascular:     Rate and Rhythm: Normal rate and regular rhythm.     Heart sounds: Normal heart sounds. No murmur heard.    No gallop.  Pulmonary:     Effort: No accessory muscle usage or respiratory distress.     Breath sounds: Normal breath sounds.  Abdominal:     General: Bowel sounds are normal. There is no distension.     Palpations: Abdomen is soft.     Tenderness: There is no abdominal tenderness.   Musculoskeletal:     Cervical back: Full passive range of motion without pain.     Thoracic back: Tenderness present. No swelling. Normal range of motion. Scoliosis present.     Lumbar back: Tenderness present. No swelling or spasms. Decreased range of motion. Positive right straight leg raise test. Negative left straight leg  raise test.     Right lower leg: No edema.     Left lower leg: No edema.     Comments: Reduced flexion, extension, and lateral.  Fingers to knees only.  No rashes noted.  Wearing back brace.  Tenderness to lower right gluteal area and mid thoracic spine.  Lymphadenopathy:     Cervical: No cervical adenopathy.   Skin:    General: Skin is warm.     Capillary Refill: Capillary refill takes less than 2 seconds.   Neurological:     Mental Status: He is alert and oriented to person, place, and time.     Deep Tendon Reflexes: Reflexes are normal and symmetric.     Reflex Scores:      Brachioradialis reflexes are 2+ on the right side and 2+ on the left side.      Patellar reflexes are 2+ on the right side and 2+ on the left side.  Psychiatric:        Attention and Perception: Attention normal.        Mood and Affect: Mood normal.        Speech: Speech normal.        Behavior: Behavior normal. Behavior is cooperative.        Thought Content: Thought content normal.     Results for orders placed or performed during the hospital encounter of 02/25/24  CBC with Differential   Collection Time: 02/25/24  1:34 PM  Result Value Ref Range   WBC 4.8 4.0 - 10.5 K/uL   RBC 4.81 4.22 - 5.81 MIL/uL   Hemoglobin 13.4 13.0 - 17.0 g/dL   HCT 58.2 60.9 - 47.9 %   MCV 86.7 80.0 - 100.0 fL   MCH 27.9 26.0 - 34.0 pg   MCHC 32.1 30.0 - 36.0 g/dL   RDW 87.7 88.4 - 84.4 %   Platelets 268 150 - 400 K/uL   nRBC 0.0 0.0 - 0.2 %   Neutrophils Relative % 75 %   Neutro Abs 3.6 1.7 - 7.7 K/uL   Lymphocytes Relative 19 %   Lymphs Abs 0.9 0.7 - 4.0 K/uL   Monocytes Relative 5 %   Monocytes Absolute 0.2 0.1 - 1.0 K/uL   Eosinophils Relative 1 %   Eosinophils Absolute 0.0 0.0 - 0.5 K/uL   Basophils Relative 0 %   Basophils Absolute 0.0 0.0 - 0.1 K/uL   Immature Granulocytes 0 %   Abs Immature Granulocytes 0.01 0.00 - 0.07 K/uL  Lipase, blood   Collection Time: 02/25/24  1:34 PM  Result Value Ref Range  Lipase 35 11 - 51 U/L  Comprehensive metabolic panel   Collection Time: 02/25/24  1:34 PM  Result Value Ref Range   Sodium 135 135 - 145 mmol/L   Potassium 3.7 3.5 - 5.1 mmol/L   Chloride 100 98 - 111 mmol/L   CO2 25 22 - 32 mmol/L   Glucose, Bld 287 (H) 70 - 99 mg/dL   BUN 16 6 - 20 mg/dL   Creatinine, Ser 9.28 0.61 - 1.24 mg/dL   Calcium  9.1 8.9 - 10.3 mg/dL   Total Protein 7.8 6.5 - 8.1 g/dL   Albumin 4.2 3.5 - 5.0 g/dL   AST 21 15 - 41 U/L   ALT 20 0 - 44 U/L   Alkaline Phosphatase 64 38 - 126 U/L   Total Bilirubin 1.5 (H) 0.0 - 1.2 mg/dL   GFR, Estimated >39 >39 mL/min   Anion gap 10 5 - 15  Urinalysis, Routine w reflex microscopic -Urine, Clean Catch   Collection Time: 02/25/24  3:53 PM  Result Value Ref Range   Color, Urine YELLOW (A) YELLOW   APPearance CLEAR (A) CLEAR   Specific Gravity, Urine 1.024 1.005 - 1.030   pH 6.0 5.0 - 8.0   Glucose, UA >=500 (A) NEGATIVE mg/dL   Hgb urine dipstick NEGATIVE NEGATIVE   Bilirubin Urine NEGATIVE NEGATIVE   Ketones, ur 20 (A) NEGATIVE mg/dL   Protein, ur NEGATIVE NEGATIVE mg/dL   Nitrite NEGATIVE NEGATIVE   Leukocytes,Ua NEGATIVE NEGATIVE   RBC / HPF 0 0 - 5 RBC/hpf   WBC, UA 0-5 0 - 5 WBC/hpf   Bacteria, UA NONE SEEN NONE SEEN   Squamous Epithelial / HPF 0 0 - 5 /HPF      Assessment & Plan:   Problem List Items Addressed This Visit       Nervous and Auditory   Acute right-sided low back pain with right-sided sciatica - Primary   Acute due to heavy lifting at work, has had similar episode in past.  Will get him into ortho for further assessment and recommendations due to significant pain.  Recommend no heavy lifting at work if possible until back is improved, nothing over 15 to 20 pounds.  Continue at home pain regimen.  Refills sent.  Will defer imaging to ortho so they can have on hand to assess in their office setting.  No red flags.      Relevant Medications   methylPREDNISolone (MEDROL DOSEPAK) 4 MG TBPK tablet    cyclobenzaprine  (FLEXERIL ) 5 MG tablet   oxyCODONE  (OXY IR/ROXICODONE ) 5 MG immediate release tablet   Other Relevant Orders   Ambulatory referral to Orthopedic Surgery     Follow up plan: Return for as July 11th scheduled.

## 2024-03-03 NOTE — Assessment & Plan Note (Addendum)
 Acute due to heavy lifting at work, has had similar episode in past.  Will get him into ortho for further assessment and recommendations due to significant pain.  Recommend no heavy lifting at work if possible until back is improved, nothing over 15 to 20 pounds.  Continue at home pain regimen.  Refills sent.  Will defer imaging to ortho so they can have on hand to assess in their office setting.  No red flags.

## 2024-03-04 ENCOUNTER — Telehealth: Payer: Self-pay | Admitting: Nurse Practitioner

## 2024-03-04 DIAGNOSIS — M546 Pain in thoracic spine: Secondary | ICD-10-CM | POA: Diagnosis not present

## 2024-03-04 NOTE — Telephone Encounter (Signed)
 Patient came in with FMLA paperwork to be completed by his provider. Placed the papers in Jolene's folder. Please follow up with Mr Branan when they are ready to be completed. Garnett [phone]

## 2024-03-05 NOTE — Telephone Encounter (Signed)
 Paperwork has been received. Will start and then give paperwork to provider to complete and sign.

## 2024-03-06 ENCOUNTER — Telehealth: Payer: Self-pay | Admitting: Nurse Practitioner

## 2024-03-06 NOTE — Telephone Encounter (Signed)
 Waiting on form to be returned by provider.

## 2024-03-06 NOTE — Telephone Encounter (Signed)
 Copied from CRM [id number]. Topic: General - Other >> Mar 06, 2024  9:20 AM Avram MATSU wrote: Reason for CRM: once FMLA form is ready please give patient a call.

## 2024-03-09 ENCOUNTER — Telehealth: Payer: Self-pay

## 2024-03-09 DIAGNOSIS — M546 Pain in thoracic spine: Secondary | ICD-10-CM | POA: Diagnosis not present

## 2024-03-09 NOTE — Telephone Encounter (Signed)
 Paperwork completed and signed by Jolene. Faxed in for the patient. Called and notified the patient that his forms were faxed and that there is a copy for him to pick up as well. Copy placed in scan be to be scanned into the patient's chart.

## 2024-03-09 NOTE — Telephone Encounter (Signed)
 Pharmacy Patient Advocate Encounter   Received notification from CoverMyMeds that prior authorization for Lidocaine  5% patches is required/requested.   Insurance verification completed.   The patient is insured through [facility] .   Per test claim: PA required; PA submitted to above mentioned insurance via CoverMyMeds Key/confirmation #/EOC [id number] Status is pending

## 2024-03-10 ENCOUNTER — Other Ambulatory Visit (HOSPITAL_COMMUNITY): Payer: Self-pay

## 2024-03-11 NOTE — Telephone Encounter (Signed)
 Pharmacy Patient Advocate Encounter  Received notification from [facility] that Prior Authorization for Lidocaine  5% patches has been DENIED.  Full denial letter will be uploaded to the media tab. See denial reason below.   PA #/Case ID/Reference #: Waiting on denial letter

## 2024-03-14 NOTE — Patient Instructions (Incomplete)
Be Involved in Caring For Your Health:  Taking Medications When medications are taken as directed, they can greatly improve your health. But if they are not taken as prescribed, they may not work. In some cases, not taking them correctly can be harmful. To help ensure your treatment remains effective and safe, understand your medications and how to take them. Bring your medications to each visit for review by your provider.  Your lab results, notes, and after visit summary will be available on My Chart. We strongly encourage you to use this feature. If lab results are abnormal the clinic will contact you with the appropriate steps. If the clinic does not contact you assume the results are satisfactory. You can always view your results on My Chart. If you have questions regarding your health or results, please contact the clinic during office hours. You can also ask questions on My Chart.  We at [facility] are grateful that you chose [place] to provide your care. We strive to provide evidence-based and compassionate care and are always looking for feedback. If you get a survey from the clinic please complete this so we can hear your opinions.  Diabetes Mellitus and Exercise Regular exercise is important for your health, especially if you have diabetes mellitus. Exercise is not just about losing weight. It can also help you increase muscle strength and bone density and reduce body fat and stress. This can help your level of endurance and make you more fit and flexible. Why should I exercise if I have diabetes? Exercise has many benefits for people with diabetes. It can: Help lower and control your blood sugar (glucose). Help your body respond better and become more sensitive to the hormone insulin. Reduce how much insulin your body needs. Lower your risk for heart disease by: Lowering how much "bad" cholesterol and triglycerides you have in your body. Increasing how much "good" cholesterol  you have in your body. Lowering your blood pressure. Lowering your blood glucose levels. What is my activity plan? Your health care provider or an expert trained in diabetes care (certified diabetes educator) can help you make an activity plan. This plan can help you find the type of exercise that works for you. It may also tell you how often to exercise and for how long. Be sure to: Get at least 150 minutes of medium-intensity or high-intensity exercise each week. This may involve brisk walking, biking, or water aerobics. Do stretching and strengthening exercises at least 2 times a week. This may involve yoga or weight lifting. Spread out your activity over at least 3 days of the week. Get some form of physical activity each day. Do not go more than 2 days in a row without some kind of activity. Avoid being inactive for more than 30 minutes at a time. Take frequent breaks to walk or stretch. Choose activities that you enjoy. Set goals that you know you can accomplish. Start slowly and increase the intensity of your exercise over time. How do I manage my diabetes during exercise?  Monitor your blood glucose Check your blood glucose before and after you exercise. If your blood glucose is 240 mg/dL (40.9 mmol/L) or higher before you exercise, check your urine for ketones. These are chemicals created by the liver. If you have ketones in your urine, do not exercise until your blood glucose returns to normal. If your blood glucose is 100 mg/dL (5.6 mmol/L) or lower, eat a snack that has 15-20 grams of carbohydrate in  it. Check your blood glucose 15 minutes after the snack to make sure that your level is above 100 mg/dL (5.6 mmol/L) before you start to exercise. Your risk for low blood glucose (hypoglycemia) goes up during and after exercise. Know the symptoms of this condition and how to treat it. Follow these instructions at home: Keep a carbohydrate snack on hand for use before, during, and after  exercise. This can help prevent or treat hypoglycemia. Avoid injecting insulin into parts of your body that are going to be used during exercise. This may include: Your arms, when you are going to play tennis. Your legs, when you are about to go jogging. Keep track of your exercise habits. This can help you and your health care provider watch and adjust your activity plan. Write down: What you eat before and after you exercise. Blood glucose levels before and after you exercise. The type and amount of exercise you do. Talk to your health care provider before you start a new activity. They may need to: Make sure that the activity is safe for you. Adjust your insulin, other medicines, and food that you eat. Drink water while you exercise. This can stop you from losing too much water (dehydration). It can also prevent problems caused by having a lot of heat in your body (heat stroke). Where to find more information American Diabetes Association: diabetes.org Association of Diabetes Care & Education Specialists: diabeteseducator.org This information is not intended to replace advice given to you by your health care provider. Make sure you discuss any questions you have with your health care provider. Document Revised: 02/14/2022 Document Reviewed: 02/14/2022 Elsevier Patient Education  2024 [employer].

## 2024-03-17 DIAGNOSIS — M546 Pain in thoracic spine: Secondary | ICD-10-CM | POA: Diagnosis not present

## 2024-03-20 ENCOUNTER — Ambulatory Visit: Admitting: Nurse Practitioner

## 2024-03-20 DIAGNOSIS — M5441 Lumbago with sciatica, right side: Secondary | ICD-10-CM

## 2024-03-20 DIAGNOSIS — E669 Obesity, unspecified: Secondary | ICD-10-CM

## 2024-03-20 DIAGNOSIS — G4733 Obstructive sleep apnea (adult) (pediatric): Secondary | ICD-10-CM

## 2024-03-20 DIAGNOSIS — E119 Type 2 diabetes mellitus without complications: Secondary | ICD-10-CM

## 2024-03-20 DIAGNOSIS — F5101 Primary insomnia: Secondary | ICD-10-CM

## 2024-03-20 DIAGNOSIS — E1159 Type 2 diabetes mellitus with other circulatory complications: Secondary | ICD-10-CM

## 2024-03-20 DIAGNOSIS — E1169 Type 2 diabetes mellitus with other specified complication: Secondary | ICD-10-CM

## 2024-03-23 ENCOUNTER — Ambulatory Visit: Admitting: Nurse Practitioner

## 2024-03-25 ENCOUNTER — Ambulatory Visit: Admitting: Nurse Practitioner

## 2024-03-25 ENCOUNTER — Encounter: Payer: Self-pay | Admitting: Nurse Practitioner

## 2024-03-25 VITALS — BP 140/83 | HR 77 | Temp 97.8°F | Wt 215.0 lb

## 2024-03-25 DIAGNOSIS — E1169 Type 2 diabetes mellitus with other specified complication: Secondary | ICD-10-CM

## 2024-03-25 DIAGNOSIS — I152 Hypertension secondary to endocrine disorders: Secondary | ICD-10-CM

## 2024-03-25 DIAGNOSIS — E669 Obesity, unspecified: Secondary | ICD-10-CM | POA: Diagnosis not present

## 2024-03-25 DIAGNOSIS — E1159 Type 2 diabetes mellitus with other circulatory complications: Secondary | ICD-10-CM

## 2024-03-25 DIAGNOSIS — Z7985 Long-term (current) use of injectable non-insulin antidiabetic drugs: Secondary | ICD-10-CM

## 2024-03-25 DIAGNOSIS — E785 Hyperlipidemia, unspecified: Secondary | ICD-10-CM

## 2024-03-25 DIAGNOSIS — E119 Type 2 diabetes mellitus without complications: Secondary | ICD-10-CM | POA: Diagnosis not present

## 2024-03-25 NOTE — Progress Notes (Unsigned)
 BP (!) 140/83   Pulse 77   Temp 97.8 F (36.6 C) (Oral)   Wt 215 lb (97.5 kg)   SpO2 98%   BMI 32.88 kg/m    Subjective:    Patient ID: Kenneth Cooper, male    DOB: 22-Sep-1965, 58 y.o.   MRN: [id number]  HPI: Kenneth Cooper is a 58 y.o. male  Chief Complaint  Patient presents with   Back Pain   DIABETES Diagnosed many years ago.  At visit on 12/20/23 we increased Ozempic  to 1 MG which he is tolerating.  Has been losing weight and has decreased appetite.  A1c in April was 8.5%. Walking an hour a day, his wife is strict on this.  Did not tolerate Jardiance, made him very tired. Metformin caused him to feel tired and did not feel good. Took Actos and Glipizide in past, does not recall these. Hypoglycemic episodes:no Polydipsia/polyuria: yes- going to the bathroom a lot Visual disturbance: no Chest pain: no Paresthesias: no Glucose Monitoring: no  Accucheck frequency: Not Checking - just got the meter  Fasting glucose:   Post prandial:  Evening:  Before meals: Taking Insulin?: no  Long acting insulin:  Short acting insulin: Blood Pressure Monitoring: not checking Retinal Examination: Not up to Date -- [facility] (still hasn't scheduled) Foot Exam: Not up to Date Diabetic Education: Not Completed Pneumovax: Up to Date Influenza: Up to Date Aspirin: no   HYPERTENSION / HYPERLIPIDEMIA Takes Amlodipine , Losartan , and Rosuvastatin .  Satisfied with current treatment? yes Duration of hypertension: chronic BP monitoring frequency: not checking BP range:  BP medication side effects: no Duration of hyperlipidemia: chronic Cholesterol medication side effects: no Cholesterol supplements: none Medication compliance: good compliance Aspirin: no Recent stressors: no Recurrent headaches: no Visual changes: no Palpitations: no Dyspnea: no Chest pain: no Lower extremity edema: no Dizzy/lightheaded: no   INSOMNIA Takes Trazodone  50 MG nightly as needed.  Does have OSA,  but does not use CPAP because it was leaking water -- did not try new equipment, just got pill. Not sleeping well.  Has been taking his wife's little pink pill.  That has been helping him sleep.   Duration: years Satisfied with sleep quality: yes Difficulty falling asleep: yes Difficulty staying asleep: yes Waking a few hours after sleep onset: no Early morning awakenings: no Daytime hypersomnolence: no Wakes feeling refreshed: sometimes Good sleep hygiene: yes Apnea: yes Snoring: no Depressed/anxious mood: no Recent stress: no Restless legs/nocturnal leg cramps: no Chronic pain/arthritis: no History of sleep study: yes Treatments attempted: Trazodone  and ambien    Relevant past medical, surgical, family and social history reviewed and updated as indicated. Interim medical history since our last visit reviewed. Allergies and medications reviewed and updated.  Review of Systems  Constitutional:  Negative for activity change, diaphoresis, fatigue and fever.  Respiratory:  Negative for cough, chest tightness, shortness of breath and wheezing.   Cardiovascular:  Negative for chest pain, palpitations and leg swelling.  Gastrointestinal: Negative.   Endocrine: Negative for polydipsia, polyphagia and polyuria.  Neurological: Negative.   Psychiatric/Behavioral: Negative.     Per HPI unless specifically indicated above     Objective:     BP (!) 140/83   Pulse 77   Temp 97.8 F (36.6 C) (Oral)   Wt 215 lb (97.5 kg)   SpO2 98%   BMI 32.88 kg/m   Wt Readings from Last 3 Encounters:  03/25/24 215 lb (97.5 kg)  03/03/24 217 lb (98.4 kg)  02/25/24 216 lb (98 kg)    Physical Exam Vitals and nursing note reviewed.  Constitutional:      General: He is awake. He is not in acute distress.    Appearance: He is well-developed and well-groomed. He is obese. He is not ill-appearing or toxic-appearing.  HENT:     Head: Normocephalic.     Right Ear: Hearing and external ear normal.      Left Ear: Hearing and external ear normal.  Eyes:     General: Lids are normal.     Extraocular Movements: Extraocular movements intact.     Conjunctiva/sclera: Conjunctivae normal.  Neck:     Thyroid: No thyromegaly.     Vascular: No carotid bruit.  Cardiovascular:     Rate and Rhythm: Normal rate and regular rhythm.     Heart sounds: Normal heart sounds. No murmur heard.    No gallop.  Pulmonary:     Effort: No accessory muscle usage or respiratory distress.     Breath sounds: Normal breath sounds.  Abdominal:     General: Bowel sounds are normal. There is no distension.     Palpations: Abdomen is soft.     Tenderness: There is no abdominal tenderness.  Musculoskeletal:     Cervical back: Full passive range of motion without pain.     Right lower leg: No edema.     Left lower leg: No edema.  Lymphadenopathy:     Cervical: No cervical adenopathy.  Skin:    General: Skin is warm.     Capillary Refill: Capillary refill takes less than 2 seconds.  Neurological:     Mental Status: He is alert and oriented to person, place, and time.     Deep Tendon Reflexes: Reflexes are normal and symmetric.     Reflex Scores:      Brachioradialis reflexes are 2+ on the right side and 2+ on the left side.      Patellar reflexes are 2+ on the right side and 2+ on the left side. Psychiatric:        Attention and Perception: Attention normal.        Mood and Affect: Mood normal.        Speech: Speech normal.        Behavior: Behavior normal. Behavior is cooperative.        Thought Content: Thought content normal.    Results for orders placed or performed in visit on 03/25/24  Comprehensive metabolic panel with GFR   Collection Time: 03/25/24  3:48 PM  Result Value Ref Range   Glucose 332 (H) 70 - 99 mg/dL   BUN 14 6 - 24 mg/dL   Creatinine, Ser 9.09 0.76 - 1.27 mg/dL   eGFR 99 >40 fO/fpw/8.26   BUN/Creatinine Ratio 16 9 - 20   Sodium 138 134 - 144 mmol/L   Potassium 4.0 3.5 - 5.2  mmol/L   Chloride 97 96 - 106 mmol/L   CO2 24 20 - 29 mmol/L   Calcium  9.8 8.7 - 10.2 mg/dL   Total Protein 7.2 6.0 - 8.5 g/dL   Albumin 4.5 3.8 - 4.9 g/dL   Globulin, Total 2.7 1.5 - 4.5 g/dL   Bilirubin Total 0.8 0.0 - 1.2 mg/dL   Alkaline Phosphatase 91 44 - 121 IU/L   AST 15 0 - 40 IU/L   ALT 17 0 - 44 IU/L  Hemoglobin A1c   Collection Time: 03/25/24  3:48 PM  Result Value Ref Range  Hgb A1c MFr Bld 9.9 (H) 4.8 - 5.6 %   Est. average glucose Bld gHb Est-mCnc 237 mg/dL  Lipid panel   Collection Time: 03/25/24  3:48 PM  Result Value Ref Range   Cholesterol, Total 131 100 - 199 mg/dL   Triglycerides 845 (H) 0 - 149 mg/dL   HDL 48 >60 mg/dL   VLDL Cholesterol Cal 26 5 - 40 mg/dL   LDL Chol Calc (NIH) 57 0 - 99 mg/dL   Chol/HDL Ratio 2.7 0.0 - 5.0 ratio      Assessment & Plan:   Problem List Items Addressed This Visit       Cardiovascular and Mediastinum   Hypertension associated with diabetes (HCC)   Chronic, stable.  BP well at goal today. Recommend she monitor BP at least a few mornings a week at home and document.  DASH diet at home.  Continue current medication regimen and adjust as needed.  Labs today: CBC, CMP, TSH, urine ALB.        Relevant Orders   Comprehensive metabolic panel with GFR (Completed)     Endocrine   Type 2 diabetes mellitus with obesity (HCC) - Primary   Chronic, ongoing.  Last A1c April was 8.5%, we went up on Ozempic  to 1 MG which he is tolerating.  Did not tolerate Jardiance or Metformin in the past.  Urine ALB 18 Jan 2024.  If elevated, will increase Ozempic  to 2mg .  May need to consider insulin in the future if A1c remains uncontrolled.  - Continue Ozempic  1 MG with goal A1c <7% , could consider Glipizide in future at low dose or retrial SGLT2. - Recommend he check BS daily fasting in morning with goal <130. - Foot exam up to date. Eye exam referral placed last visit. - Vaccinations up to date. - Statin on board and ARB.      Relevant  Orders   Hemoglobin A1c (Completed)   Hyperlipidemia associated with type 2 diabetes mellitus (HCC)   Chronic, ongoing.  Continue current medication regimen and adjust as needed.  Lipid panel today.      Relevant Orders   Lipid panel (Completed)   Diabetes mellitus treated with injections of non-insulin medication (HCC)     Follow up plan: Return in about 3 months (around 06/25/2024) for HTN, HLD, DM2 FU (with PCP).

## 2024-03-26 ENCOUNTER — Ambulatory Visit: Payer: Self-pay | Admitting: Nurse Practitioner

## 2024-03-26 ENCOUNTER — Ambulatory Visit: Admitting: Nurse Practitioner

## 2024-03-26 LAB — COMPREHENSIVE METABOLIC PANEL WITH GFR
ALT: 17 IU/L (ref 0–44)
AST: 15 IU/L (ref 0–40)
Albumin: 4.5 g/dL (ref 3.8–4.9)
Alkaline Phosphatase: 91 IU/L (ref 44–121)
BUN/Creatinine Ratio: 16 (ref 9–20)
BUN: 14 mg/dL (ref 6–24)
Bilirubin Total: 0.8 mg/dL (ref 0.0–1.2)
CO2: 24 mmol/L (ref 20–29)
Calcium: 9.8 mg/dL (ref 8.7–10.2)
Chloride: 97 mmol/L (ref 96–106)
Creatinine, Ser: 0.9 mg/dL (ref 0.76–1.27)
Globulin, Total: 2.7 g/dL (ref 1.5–4.5)
Glucose: 332 mg/dL — ABNORMAL HIGH (ref 70–99)
Potassium: 4 mmol/L (ref 3.5–5.2)
Sodium: 138 mmol/L (ref 134–144)
Total Protein: 7.2 g/dL (ref 6.0–8.5)
eGFR: 99 mL/min/1.73 (ref >59)

## 2024-03-26 LAB — LIPID PANEL
Chol/HDL Ratio: 2.7 ratio (ref 0.0–5.0)
Cholesterol, Total: 131 mg/dL (ref 100–199)
HDL: 48 mg/dL (ref >39)
LDL Chol Calc (NIH): 57 mg/dL (ref 0–99)
Triglycerides: 154 mg/dL — ABNORMAL HIGH (ref 0–149)
VLDL Cholesterol Cal: 26 mg/dL (ref 5–40)

## 2024-03-26 LAB — HEMOGLOBIN A1C
Est. average glucose Bld gHb Est-mCnc: 237 mg/dL
Hgb A1c MFr Bld: 9.9 % — ABNORMAL HIGH (ref 4.8–5.6)

## 2024-03-26 MED ORDER — SEMAGLUTIDE (2 MG/DOSE) 8 MG/3ML ~~LOC~~ SOPN: 2.0000 mg | PEN_INJECTOR | SUBCUTANEOUS | 0 refills | Status: DC

## 2024-03-26 NOTE — Assessment & Plan Note (Signed)
 Chronic, stable.  BP well at goal today. Recommend she monitor BP at least a few mornings a week at home and document.  DASH diet at home.  Continue current medication regimen and adjust as needed.  Labs today: CBC, CMP, TSH, urine ALB.

## 2024-03-26 NOTE — Assessment & Plan Note (Signed)
 Chronic, ongoing.  Last A1c April was 8.5%, we went up on Ozempic  to 1 MG which he is tolerating.  Did not tolerate Jardiance or Metformin in the past.  Urine ALB 18 Jan 2024.  If elevated, will increase Ozempic  to 2mg .  May need to consider insulin in the future if A1c remains uncontrolled.  - Continue Ozempic  1 MG with goal A1c <7% , could consider Glipizide in future at low dose or retrial SGLT2. - Recommend he check BS daily fasting in morning with goal <130. - Foot exam up to date. Eye exam referral placed last visit. - Vaccinations up to date. - Statin on board and ARB.

## 2024-03-26 NOTE — Telephone Encounter (Signed)
 Medication sent to the pharmacy.

## 2024-03-26 NOTE — Assessment & Plan Note (Signed)
 Chronic, ongoing.  Continue current medication regimen and adjust as needed. Lipid panel today.

## 2024-03-27 DIAGNOSIS — M546 Pain in thoracic spine: Secondary | ICD-10-CM | POA: Diagnosis not present

## 2024-03-31 ENCOUNTER — Other Ambulatory Visit: Payer: Self-pay | Admitting: Nurse Practitioner

## 2024-04-01 DIAGNOSIS — M546 Pain in thoracic spine: Secondary | ICD-10-CM | POA: Diagnosis not present

## 2024-04-02 NOTE — Telephone Encounter (Signed)
 Requested medication (s) are due for refill today: na   Requested medication (s) are on the active medication list: yes   Last refill:  03/03/24 #90 0 refills  Future visit scheduled: yes 06/26/24  Notes to clinic:  not delegated per protocol. Do you want to refill Rx?     Requested Prescriptions  Pending Prescriptions Disp Refills   cyclobenzaprine  (FLEXERIL ) 5 MG tablet [Pharmacy Med Name: CYCLOBENZAPRINE  5 MG TABLET] 90 tablet 0    Sig: TAKE 1 TABLET BY MOUTH 3 TIMES DAILY AS NEEDED.     Not Delegated - Analgesics:  Muscle Relaxants Failed - 04/02/2024  9:52 AM      Failed - This refill cannot be delegated      Passed - Valid encounter within last 6 months    Recent Outpatient Visits           1 week ago Type 2 diabetes mellitus with obesity (HCC)   [facility] [place] Melvin Pao, NP   1 month ago Acute right-sided low back pain with right-sided sciatica   [place] [facility] [place], Melanie T, NP   2 months ago Type 2 diabetes mellitus with obesity (HCC)   [place] [facility] [place], [place] T, NP   3 months ago Type 2 diabetes mellitus with obesity [facility])   [place] [facility] [place], Melanie DASEN, NP

## 2024-04-06 ENCOUNTER — Telehealth: Payer: Self-pay

## 2024-04-06 NOTE — Telephone Encounter (Unsigned)
 Copied from CRM #[id number]. Topic: General - Other >> Apr 06, 2024 11:34 AM Treva T wrote: Reason for CRM: Alex, calling with [employer], has questions regarding patient care, diagnosis, an appointment history.    Please return the call at [phone] to discuss further, using below reference number.  Reference # [id number].  Thank you

## 2024-04-07 DIAGNOSIS — M546 Pain in thoracic spine: Secondary | ICD-10-CM | POA: Diagnosis not present

## 2024-04-14 DIAGNOSIS — M546 Pain in thoracic spine: Secondary | ICD-10-CM | POA: Diagnosis not present

## 2024-04-20 ENCOUNTER — Other Ambulatory Visit: Payer: Self-pay | Admitting: Nurse Practitioner

## 2024-04-20 MED ORDER — SEMAGLUTIDE (2 MG/DOSE) 8 MG/3ML ~~LOC~~ SOPN: 2.0000 mg | PEN_INJECTOR | SUBCUTANEOUS | 4 refills | Status: DC

## 2024-04-22 DIAGNOSIS — M546 Pain in thoracic spine: Secondary | ICD-10-CM | POA: Diagnosis not present

## 2024-04-27 DIAGNOSIS — M7541 Impingement syndrome of right shoulder: Secondary | ICD-10-CM | POA: Diagnosis not present

## 2024-04-27 DIAGNOSIS — M542 Cervicalgia: Secondary | ICD-10-CM | POA: Diagnosis not present

## 2024-04-27 DIAGNOSIS — M25511 Pain in right shoulder: Secondary | ICD-10-CM | POA: Diagnosis not present

## 2024-05-15 DIAGNOSIS — M50322 Other cervical disc degeneration at C5-C6 level: Secondary | ICD-10-CM | POA: Diagnosis not present

## 2024-05-15 DIAGNOSIS — M7541 Impingement syndrome of right shoulder: Secondary | ICD-10-CM | POA: Diagnosis not present

## 2024-05-15 DIAGNOSIS — M50323 Other cervical disc degeneration at C6-C7 level: Secondary | ICD-10-CM | POA: Diagnosis not present

## 2024-05-18 ENCOUNTER — Other Ambulatory Visit: Payer: Self-pay | Admitting: Nurse Practitioner

## 2024-05-19 NOTE — Telephone Encounter (Signed)
 Requested Prescriptions  Refused Prescriptions Disp Refills   rosuvastatin  (CRESTOR ) 10 MG tablet [Pharmacy Med Name: ROSUVASTATIN  CALCIUM  10 MG TAB] 90 tablet 2    Sig: TAKE 1 TABLET BY MOUTH EVERY DAY     Cardiovascular:  Antilipid - Statins 2 Failed - 05/19/2024  3:48 PM      Failed - Lipid Panel in normal range within the last 12 months    Cholesterol, Total  Date Value Ref Range Status  03/25/2024 131 100 - 199 mg/dL Final   LDL Chol Calc (NIH)  Date Value Ref Range Status  03/25/2024 57 0 - 99 mg/dL Final   HDL  Date Value Ref Range Status  03/25/2024 48 >39 mg/dL Final   Triglycerides  Date Value Ref Range Status  03/25/2024 154 (H) 0 - 149 mg/dL Final         Passed - Cr in normal range and within 360 days    Creatinine, Ser  Date Value Ref Range Status  03/25/2024 0.90 0.76 - 1.27 mg/dL Final         Passed - Patient is not pregnant      Passed - Valid encounter within last 12 months    Recent Outpatient Visits           1 month ago Type 2 diabetes mellitus with obesity (HCC)   [place] [facility] Melvin Pao, NP   2 months ago Acute right-sided low back pain with right-sided sciatica   [place] [facility] [place], Jolene T, NP   3 months ago Type 2 diabetes mellitus with obesity (HCC)   [place] [facility] [place], [place] T, NP   5 months ago Type 2 diabetes mellitus with obesity (HCC)   [place] [facility] [place], Melanie DASEN, NP

## 2024-05-27 DIAGNOSIS — M25511 Pain in right shoulder: Secondary | ICD-10-CM | POA: Diagnosis not present

## 2024-05-29 ENCOUNTER — Telehealth: Payer: Self-pay | Admitting: Nurse Practitioner

## 2024-05-29 NOTE — Telephone Encounter (Signed)
 Patient brought in [facility] paperwork and stated that is was completed incorrectly. The is a 15 day grace period to have completed.  The old forms that were were pulled and placed with the current one and placed in the providers folder please fax when complete.

## 2024-06-02 DIAGNOSIS — M75121 Complete rotator cuff tear or rupture of right shoulder, not specified as traumatic: Secondary | ICD-10-CM | POA: Diagnosis not present

## 2024-06-03 NOTE — Telephone Encounter (Signed)
 Patient called in for status of paperwork for FMLA, I let him know of 15 day period allowed to complete

## 2024-06-03 NOTE — Telephone Encounter (Signed)
 Noted

## 2024-06-03 NOTE — Telephone Encounter (Signed)
 Patient aware his paperwork has been completed and faxed. He is welcome to pickup a copy at any time.

## 2024-06-16 ENCOUNTER — Other Ambulatory Visit: Payer: Self-pay | Admitting: Nurse Practitioner

## 2024-06-16 MED ORDER — LOSARTAN POTASSIUM 100 MG PO TABS: 100.0000 mg | ORAL_TABLET | Freq: Every day | ORAL | 2 refills | Status: DC

## 2024-06-16 MED ORDER — ROSUVASTATIN CALCIUM 10 MG PO TABS: 10.0000 mg | ORAL_TABLET | Freq: Every day | ORAL | 2 refills | Status: AC

## 2024-06-16 MED ORDER — TRAZODONE HCL 50 MG PO TABS: 50.0000 mg | ORAL_TABLET | Freq: Every evening | ORAL | 2 refills | Status: DC | PRN

## 2024-06-16 MED ORDER — LIDOCAINE 5 % EX PTCH: 1.0000 | MEDICATED_PATCH | CUTANEOUS | 0 refills | Status: AC

## 2024-06-16 MED ORDER — AMLODIPINE BESYLATE 5 MG PO TABS: 5.0000 mg | ORAL_TABLET | Freq: Every day | ORAL | 2 refills | Status: AC

## 2024-06-16 NOTE — Telephone Encounter (Signed)
Sending to provider for review.

## 2024-06-16 NOTE — Telephone Encounter (Signed)
 Prescriptions have been approved by PCP and sent to patients pharmacy.

## 2024-06-16 NOTE — Telephone Encounter (Signed)
 Patient came into the office and stated that he needs  all of his medications refilled. He was last seen on 7/16 and meds were filled last by Dr. Lenon at [facility]. [facility] would [facility] [place]. Please advise or call patient for appt if needed.

## 2024-06-17 ENCOUNTER — Telehealth: Payer: Self-pay

## 2024-06-17 ENCOUNTER — Other Ambulatory Visit (HOSPITAL_COMMUNITY): Payer: Self-pay

## 2024-06-17 ENCOUNTER — Telehealth: Payer: Self-pay | Admitting: Nurse Practitioner

## 2024-06-17 NOTE — Telephone Encounter (Signed)
 Pharmacy Patient Advocate Encounter   Received notification from Onbase that prior authorization for Lidocaine  5% patches is required/requested.   Insurance verification completed.   The patient is insured through [facility].   Per test claim: PA required; PA submitted to above mentioned insurance via Latent Key/confirmation #/EOC [id number] Status is pending

## 2024-06-17 NOTE — Telephone Encounter (Signed)
 Copied from CRM [id number]. Topic: General - Other >> Jun 17, 2024 11:32 AM Jasmin G wrote: Reason for CRM: Ms. Raymond from [employer] called due to needing additional info for pt's short term disability claim, Ms. Raymond requested appt notes regarding treatment for back and shoulder pain if treated prior to June 17th. Please call at [phone] to discuss.

## 2024-06-19 NOTE — Telephone Encounter (Signed)
*  PA canceled by provider per Latent

## 2024-06-21 NOTE — Patient Instructions (Signed)

## 2024-06-22 ENCOUNTER — Other Ambulatory Visit: Payer: Self-pay | Admitting: Nurse Practitioner

## 2024-06-23 NOTE — Telephone Encounter (Signed)
 Called on 06/19/2024 and left message for return call. We can fax information if we have the fax numbers.

## 2024-06-24 DIAGNOSIS — Z01818 Encounter for other preprocedural examination: Secondary | ICD-10-CM | POA: Diagnosis not present

## 2024-06-24 NOTE — Telephone Encounter (Signed)
 Requested Prescriptions  Pending Prescriptions Disp Refills   Semaglutide , 2 MG/DOSE, (OZEMPIC , 2 MG/DOSE,) 8 MG/3ML SOPN [Pharmacy Med Name: OZEMPIC  8 MG/3 ML (2 MG/DOSE)] 6 mL 0    Sig: INJECT 2 MG AS DIRECTED ONCE A WEEK.     Endocrinology:  Diabetes - GLP-1 Receptor Agonists - semaglutide  Failed - 06/24/2024  2:45 PM      Failed - HBA1C in normal range and within 180 days    Hgb A1c MFr Bld  Date Value Ref Range Status  03/25/2024 9.9 (H) 4.8 - 5.6 % Final    Comment:             Prediabetes: 5.7 - 6.4          Diabetes: >6.4          Glycemic control for adults with diabetes: <7.0          Passed - Cr in normal range and within 360 days    Creatinine, Ser  Date Value Ref Range Status  03/25/2024 0.90 0.76 - 1.27 mg/dL Final         Passed - Valid encounter within last 6 months    Recent Outpatient Visits           3 months ago Type 2 diabetes mellitus with obesity   [place] [facility] Melvin Pao, NP   3 months ago Acute right-sided low back pain with right-sided sciatica   [place] [facility] [place], [place] T, NP   5 months ago Type 2 diabetes mellitus with obesity   [place] [facility] [place], [place] T, NP   6 months ago Type 2 diabetes mellitus with obesity   [place] [facility] [place], Melanie DASEN, NP

## 2024-06-26 ENCOUNTER — Encounter: Payer: Self-pay | Admitting: Nurse Practitioner

## 2024-06-26 ENCOUNTER — Ambulatory Visit: Admitting: Nurse Practitioner

## 2024-06-26 VITALS — BP 128/85 | HR 82 | Temp 98.7°F | Ht 67.8 in | Wt 215.0 lb

## 2024-06-26 DIAGNOSIS — E785 Hyperlipidemia, unspecified: Secondary | ICD-10-CM

## 2024-06-26 DIAGNOSIS — G4733 Obstructive sleep apnea (adult) (pediatric): Secondary | ICD-10-CM

## 2024-06-26 DIAGNOSIS — E119 Type 2 diabetes mellitus without complications: Secondary | ICD-10-CM | POA: Diagnosis not present

## 2024-06-26 DIAGNOSIS — Z23 Encounter for immunization: Secondary | ICD-10-CM

## 2024-06-26 DIAGNOSIS — E669 Obesity, unspecified: Secondary | ICD-10-CM

## 2024-06-26 DIAGNOSIS — E1159 Type 2 diabetes mellitus with other circulatory complications: Secondary | ICD-10-CM

## 2024-06-26 DIAGNOSIS — F5101 Primary insomnia: Secondary | ICD-10-CM

## 2024-06-26 DIAGNOSIS — E1169 Type 2 diabetes mellitus with other specified complication: Secondary | ICD-10-CM | POA: Diagnosis not present

## 2024-06-26 DIAGNOSIS — I152 Hypertension secondary to endocrine disorders: Secondary | ICD-10-CM

## 2024-06-26 DIAGNOSIS — Z7985 Long-term (current) use of injectable non-insulin antidiabetic drugs: Secondary | ICD-10-CM

## 2024-06-26 LAB — BAYER DCA HB A1C WAIVED: HB A1C (BAYER DCA - WAIVED): 9.1 % — ABNORMAL HIGH (ref 4.8–5.6)

## 2024-06-26 MED ORDER — TIRZEPATIDE 7.5 MG/0.5ML ~~LOC~~ SOAJ: 7.5000 mg | SUBCUTANEOUS | 5 refills | Status: DC

## 2024-06-26 MED ORDER — ESZOPICLONE 1 MG PO TABS: 1.0000 mg | ORAL_TABLET | Freq: Every evening | ORAL | 2 refills | Status: DC | PRN

## 2024-06-26 NOTE — Assessment & Plan Note (Signed)
 Chronic, stable.  BP close to goal today. Recommend she monitor BP at least a few mornings a week at home and document.  DASH diet at home.  Continue current medication regimen and adjust as needed.  Labs today: CMP.

## 2024-06-26 NOTE — Assessment & Plan Note (Signed)
 Chronic, ongoing.  Suspect some related to OSA, but he prefers to not pursue another study at this time or look into new equipment. Stop Trazodone  as offering no benefit.  Start Lunesta 1 MG nightly and assess if benefit.  Monitor closely.  Ambien caused too much fatigue in past.  Recommend: ?Maintain a regular sleep schedule, particularly a regular wake-up time in the morning ?Try not to force sleep ?Avoid caffeinated beverages after lunch ?Avoid alcohol near bedtime (eg, late afternoon and evening)  ?Avoid smoking or other nicotine intake, particularly during the evening ?Adjust the bedroom environment as needed to decrease stimuli (eg, reduce ambient light, turn off the television or radio) ?Avoid prolonged use of light-emitting screens (laptops, tablets, smartphones, ebooks) before bedtime  ?Resolve concerns or worries before bedtime ?Exercise regularly for at least 20 minutes, preferably more than four to five hours prior to bedtime  ?Avoid daytime naps, especially if they are longer than 20 to 30 minutes or occur late in the day

## 2024-06-26 NOTE — Assessment & Plan Note (Signed)
BMI 32.88.  Recommended eating smaller high protein, low fat meals more frequently and exercising 30 mins a day 5 times a week with a goal of 10-15lb weight loss in the next 3 months. Patient voiced their understanding and motivation to adhere to these recommendations.  

## 2024-06-26 NOTE — Assessment & Plan Note (Signed)
 Is not using CPAP, discussed with him.  May benefit repeat study and different equipment in the future.  For now he prefers not to pursue this. Could even consider evaluation for Inspire.

## 2024-06-26 NOTE — Assessment & Plan Note (Signed)
 Chronic, ongoing. A1c today 9.1%, down from 9.9% but still above goal.  Discussed with him at length.  Did not tolerate Jardiance or Metformin in the past + recalls side effects with Glipizide and Actos.  Recommend he check blood sugars twice a day.  Provided him with sheet to document sugars on with goals at top for sugar levels. Urine ALB 18 Jan 2024. - Continue Ozempic  2 MG until pen complete and then switch over to Mounjaro 7.5 MG weekly which may offer more benefit to sugar levels and weight. Could consider Glipizide in future at low dose or retrial SGLT2. - Recommend he check BS daily fasting in morning with goal <130. - Foot exam up to date. Eye exam needed. - Vaccinations up to date. - Statin on board and ARB.

## 2024-06-26 NOTE — Assessment & Plan Note (Signed)
 Chronic, ongoing.  Continue current medication regimen and adjust as needed. Lipid panel today.

## 2024-06-26 NOTE — Assessment & Plan Note (Signed)
 Refer to diabetes with obesity plan of care.

## 2024-06-26 NOTE — Progress Notes (Signed)
 BP 128/85   Pulse 82   Temp 98.7 F (37.1 C) (Oral)   Ht 5' 7.8 (1.722 m)   Wt 215 lb (97.5 kg)   SpO2 97%   BMI 32.88 kg/m    Subjective:    Patient ID: Kenneth Cooper, male    DOB: 02/16/1966, 58 y.o.   MRN: [id number]  HPI: Kenneth Cooper is a 58 y.o. male  Chief Complaint  Patient presents with   Diabetes    No recent eye exam per patient   Hyperlipidemia   Hypertension   DIABETES A1c July 9.9%. Currently takes Ozempic  2 MG. Currently not working due to right shoulder injury which took place at work. He does notice he gets full quicker.  HISTORY: Did not tolerate Jardiance, made him very tired. Metformin caused him to feel tired and did not feel good. Took Actos and Glipizide in past, does not recall these. Hypoglycemic episodes:no Polydipsia/polyuria: no Visual disturbance: no Chest pain: no Paresthesias: no Glucose Monitoring: no  Accucheck frequency: Not Checking  Fasting glucose:  Post prandial:  Evening:  Before meals: Taking Insulin?: no  Long acting insulin:  Short acting insulin: Blood Pressure Monitoring: not checking Retinal Examination: Not up to Date -- [facility] Exam: Up To Date Diabetic Education: Not Completed Pneumovax: Up to Date Influenza: Up to Date Aspirin: no   HYPERTENSION / HYPERLIPIDEMIA Continues Amlodipine , Losartan /HCTZ, and Rosuvastatin .  Satisfied with current treatment? yes Duration of hypertension: chronic BP monitoring frequency: not checking BP range:  BP medication side effects: no Duration of hyperlipidemia: chronic Cholesterol medication side effects: no Cholesterol supplements: none Medication compliance: good compliance Aspirin: no Recent stressors: no Recurrent headaches: no Visual changes: no Palpitations: no Dyspnea: no Chest pain: no Lower extremity edema: no Dizzy/lightheaded: no   INSOMNIA Continues Trazodone , has been taking 100 MG but still not sleeping well.  Does have OSA, but is not  using CPAP.  Did not sleep well with this.   Duration: years Satisfied with sleep quality: yes Difficulty falling asleep: yes Difficulty staying asleep: yes Waking a few hours after sleep onset: no Early morning awakenings: no Daytime hypersomnolence: no Wakes feeling refreshed: sometimes Good sleep hygiene: yes Apnea: yes Snoring: no Depressed/anxious mood: no Recent stress: no Restless legs/nocturnal leg cramps: no Chronic pain/arthritis: no History of sleep study: yes Treatments attempted: Melatonin, Magnesium, Trazodone  and Ambien     Relevant past medical, surgical, family and social history reviewed and updated as indicated. Interim medical history since our last visit reviewed. Allergies and medications reviewed and updated.  Review of Systems  Constitutional:  Negative for activity change, appetite change, diaphoresis, fatigue and fever.  Respiratory:  Negative for cough, chest tightness, shortness of breath and wheezing.   Cardiovascular:  Negative for chest pain, palpitations and leg swelling.  Gastrointestinal: Negative.   Neurological: Negative.   Psychiatric/Behavioral:  Positive for sleep disturbance. Negative for decreased concentration, self-injury and suicidal ideas. The patient is not nervous/anxious.     Per HPI unless specifically indicated above     Objective:    BP 128/85   Pulse 82   Temp 98.7 F (37.1 C) (Oral)   Ht 5' 7.8 (1.722 m)   Wt 215 lb (97.5 kg)   SpO2 97%   BMI 32.88 kg/m   Wt Readings from Last 3 Encounters:  06/26/24 215 lb (97.5 kg)  03/25/24 215 lb (97.5 kg)  03/03/24 217 lb (98.4 kg)    Physical Exam Vitals and nursing note  reviewed.  Constitutional:      General: He is awake. He is not in acute distress.    Appearance: He is well-developed and well-groomed. He is obese. He is not ill-appearing or toxic-appearing.  HENT:     Head: Normocephalic.     Right Ear: Hearing and external ear normal.     Left Ear: Hearing and  external ear normal.  Eyes:     General: Lids are normal.     Extraocular Movements: Extraocular movements intact.     Conjunctiva/sclera: Conjunctivae normal.  Neck:     Thyroid: No thyromegaly.     Vascular: No carotid bruit.  Cardiovascular:     Rate and Rhythm: Normal rate and regular rhythm.     Heart sounds: Normal heart sounds. No murmur heard.    No gallop.  Pulmonary:     Effort: No accessory muscle usage or respiratory distress.     Breath sounds: Normal breath sounds.  Abdominal:     General: Bowel sounds are normal. There is no distension.     Palpations: Abdomen is soft.     Tenderness: There is no abdominal tenderness.  Musculoskeletal:     Cervical back: Full passive range of motion without pain.     Right lower leg: No edema.     Left lower leg: No edema.  Lymphadenopathy:     Cervical: No cervical adenopathy.  Skin:    General: Skin is warm.     Capillary Refill: Capillary refill takes less than 2 seconds.  Neurological:     Mental Status: He is alert and oriented to person, place, and time.     Deep Tendon Reflexes: Reflexes are normal and symmetric.     Reflex Scores:      Brachioradialis reflexes are 2+ on the right side and 2+ on the left side.      Patellar reflexes are 2+ on the right side and 2+ on the left side. Psychiatric:        Attention and Perception: Attention normal.        Mood and Affect: Mood normal.        Speech: Speech normal.        Behavior: Behavior normal. Behavior is cooperative.        Thought Content: Thought content normal.     Results for orders placed or performed in visit on 03/25/24  Comprehensive metabolic panel with GFR   Collection Time: 03/25/24  3:48 PM  Result Value Ref Range   Glucose 332 (H) 70 - 99 mg/dL   BUN 14 6 - 24 mg/dL   Creatinine, Ser 9.09 0.76 - 1.27 mg/dL   eGFR 99 >40 fO/fpw/8.26   BUN/Creatinine Ratio 16 9 - 20   Sodium 138 134 - 144 mmol/L   Potassium 4.0 3.5 - 5.2 mmol/L   Chloride 97 96 -  106 mmol/L   CO2 24 20 - 29 mmol/L   Calcium  9.8 8.7 - 10.2 mg/dL   Total Protein 7.2 6.0 - 8.5 g/dL   Albumin 4.5 3.8 - 4.9 g/dL   Globulin, Total 2.7 1.5 - 4.5 g/dL   Bilirubin Total 0.8 0.0 - 1.2 mg/dL   Alkaline Phosphatase 91 44 - 121 IU/L   AST 15 0 - 40 IU/L   ALT 17 0 - 44 IU/L  Hemoglobin A1c   Collection Time: 03/25/24  3:48 PM  Result Value Ref Range   Hgb A1c MFr Bld 9.9 (H) 4.8 - 5.6 %  Est. average glucose Bld gHb Est-mCnc 237 mg/dL  Lipid panel   Collection Time: 03/25/24  3:48 PM  Result Value Ref Range   Cholesterol, Total 131 100 - 199 mg/dL   Triglycerides 845 (H) 0 - 149 mg/dL   HDL 48 >60 mg/dL   VLDL Cholesterol Cal 26 5 - 40 mg/dL   LDL Chol Calc (NIH) 57 0 - 99 mg/dL   Chol/HDL Ratio 2.7 0.0 - 5.0 ratio      Assessment & Plan:   Problem List Items Addressed This Visit       Cardiovascular and Mediastinum   Hypertension associated with diabetes (HCC)   Chronic, stable.  BP close to goal today. Recommend she monitor BP at least a few mornings a week at home and document.  DASH diet at home.  Continue current medication regimen and adjust as needed.  Labs today: CMP.        Relevant Medications   losartan -hydrochlorothiazide (HYZAAR) 100-12.5 MG tablet   tirzepatide (MOUNJARO) 7.5 MG/0.5ML Pen   Other Relevant Orders   Bayer DCA Hb A1c Waived     Respiratory   OSA on CPAP   Is not using CPAP, discussed with him.  May benefit repeat study and different equipment in the future.  For now he prefers not to pursue this. Could even consider evaluation for Inspire.        Endocrine   Type 2 diabetes mellitus in patient with obesity (HCC) - Primary   Chronic, ongoing. A1c today 9.1%, down from 9.9% but still above goal.  Discussed with him at length.  Did not tolerate Jardiance or Metformin in the past + recalls side effects with Glipizide and Actos.  Recommend he check blood sugars twice a day.  Provided him with sheet to document sugars on with goals  at top for sugar levels. Urine ALB 18 Jan 2024. - Continue Ozempic  2 MG until pen complete and then switch over to Mounjaro 7.5 MG weekly which may offer more benefit to sugar levels and weight. Could consider Glipizide in future at low dose or retrial SGLT2. - Recommend he check BS daily fasting in morning with goal <130. - Foot exam up to date. Eye exam needed. - Vaccinations up to date. - Statin on board and ARB.      Relevant Medications   losartan -hydrochlorothiazide (HYZAAR) 100-12.5 MG tablet   tirzepatide (MOUNJARO) 7.5 MG/0.5ML Pen   Other Relevant Orders   Bayer DCA Hb A1c Waived   Hyperlipidemia associated with type 2 diabetes mellitus (HCC)   Chronic, ongoing.  Continue current medication regimen and adjust as needed.  Lipid panel today.      Relevant Medications   losartan -hydrochlorothiazide (HYZAAR) 100-12.5 MG tablet   tirzepatide (MOUNJARO) 7.5 MG/0.5ML Pen   Other Relevant Orders   Bayer DCA Hb A1c Waived   Comprehensive metabolic panel with GFR   Lipid Panel w/o Chol/HDL Ratio   Diabetes mellitus treated with injections of non-insulin medication (HCC)   Refer to diabetes with obesity plan of care.      Relevant Medications   losartan -hydrochlorothiazide (HYZAAR) 100-12.5 MG tablet   tirzepatide (MOUNJARO) 7.5 MG/0.5ML Pen   Other Relevant Orders   Bayer DCA Hb A1c Waived     Other   Obesity (BMI 30-39.9)   BMI 32.88.  Recommended eating smaller high protein, low fat meals more frequently and exercising 30 mins a day 5 times a week with a goal of 10-15lb weight loss in the next 3  months. Patient voiced their understanding and motivation to adhere to these recommendations.       Relevant Medications   tirzepatide (MOUNJARO) 7.5 MG/0.5ML Pen   Insomnia   Chronic, ongoing.  Suspect some related to OSA, but he prefers to not pursue another study at this time or look into new equipment. Stop Trazodone  as offering no benefit.  Start Lunesta 1 MG nightly and  assess if benefit.  Monitor closely.  Ambien caused too much fatigue in past.  Recommend: ?Maintain a regular sleep schedule, particularly a regular wake-up time in the morning ?Try not to force sleep ?Avoid caffeinated beverages after lunch ?Avoid alcohol near bedtime (eg, late afternoon and evening)  ?Avoid smoking or other nicotine intake, particularly during the evening ?Adjust the bedroom environment as needed to decrease stimuli (eg, reduce ambient light, turn off the television or radio) ?Avoid prolonged use of light-emitting screens (laptops, tablets, smartphones, ebooks) before bedtime  ?Resolve concerns or worries before bedtime ?Exercise regularly for at least 20 minutes, preferably more than four to five hours prior to bedtime  ?Avoid daytime naps, especially if they are longer than 20 to 30 minutes or occur late in the day         Follow up plan: Return in about 5 weeks (around 07/31/2024) for Insomnia -- Stopped Trazodone  and started Lunesta.

## 2024-06-27 LAB — COMPREHENSIVE METABOLIC PANEL WITH GFR
ALT: 23 IU/L (ref 0–44)
AST: 17 IU/L (ref 0–40)
Albumin: 4.7 g/dL (ref 3.8–4.9)
Alkaline Phosphatase: 89 IU/L (ref 47–123)
BUN/Creatinine Ratio: 15 (ref 9–20)
BUN: 16 mg/dL (ref 6–24)
Bilirubin Total: 1.1 mg/dL (ref 0.0–1.2)
CO2: 23 mmol/L (ref 20–29)
Calcium: 10.1 mg/dL (ref 8.7–10.2)
Chloride: 96 mmol/L (ref 96–106)
Creatinine, Ser: 1.1 mg/dL (ref 0.76–1.27)
Globulin, Total: 3.1 g/dL (ref 1.5–4.5)
Glucose: 247 mg/dL — ABNORMAL HIGH (ref 70–99)
Potassium: 4.2 mmol/L (ref 3.5–5.2)
Sodium: 136 mmol/L (ref 134–144)
Total Protein: 7.8 g/dL (ref 6.0–8.5)
eGFR: 78 mL/min/1.73 (ref >59)

## 2024-06-27 LAB — LIPID PANEL W/O CHOL/HDL RATIO
Cholesterol, Total: 113 mg/dL (ref 100–199)
HDL: 48 mg/dL (ref >39)
LDL Chol Calc (NIH): 49 mg/dL (ref 0–99)
Triglycerides: 78 mg/dL (ref 0–149)
VLDL Cholesterol Cal: 16 mg/dL (ref 5–40)

## 2024-06-28 ENCOUNTER — Ambulatory Visit: Payer: Self-pay | Admitting: Nurse Practitioner

## 2024-06-28 NOTE — Progress Notes (Signed)
 Good morning, please let Chauncy know his labs have returned and overall kidney and liver function remain normal + lipid panel shows levels at goal.  Glucose, sugar, was elevated as expected.  For now ensure focus on healthy diet, regular exercise, and continue all medications. Any questions? Keep being amazing!!  Thank you for allowing me to participate in your care.  I appreciate you. Kindest regards, Samarah Hogle

## 2024-06-29 ENCOUNTER — Other Ambulatory Visit (HOSPITAL_COMMUNITY): Payer: Self-pay

## 2024-06-29 ENCOUNTER — Telehealth: Payer: Self-pay | Admitting: Pharmacy Technician

## 2024-06-29 NOTE — Telephone Encounter (Signed)
 Pharmacy Patient Advocate Encounter  Received notification from [facility] that Prior Authorization for Mounjaro 7.5MG /0.5ML auto-injectors has been APPROVED from 06/29/24 to 06/29/25 Full approval letter has been uploaded to media tab.   PA #/Case ID/Reference #: [id number]

## 2024-06-29 NOTE — Telephone Encounter (Signed)
 Pharmacy Patient Advocate Encounter   Received notification from Onbase that prior authorization for Mounjaro 7.5MG /0.5ML auto-injectors  is required/requested.   Insurance verification completed.   The patient is insured through [facility].   Per test claim: PA required; PA submitted to above mentioned insurance via Latent Key/confirmation #/EOC [id number] Status is pending

## 2024-06-30 ENCOUNTER — Other Ambulatory Visit: Payer: Self-pay

## 2024-07-13 ENCOUNTER — Other Ambulatory Visit (HOSPITAL_COMMUNITY): Payer: Self-pay

## 2024-07-16 ENCOUNTER — Telehealth: Payer: Self-pay | Admitting: Pharmacy Technician

## 2024-07-16 ENCOUNTER — Other Ambulatory Visit (HOSPITAL_COMMUNITY): Payer: Self-pay

## 2024-07-16 NOTE — Telephone Encounter (Signed)
 Pharmacy Patient Advocate Encounter   Received notification from Onbase that prior authorization for Lidocaine  5% patches is required/requested.   Insurance verification completed.   The patient is insured through [employer].   Per test claim: PA required; PA submitted to above mentioned insurance via Latent Key/confirmation #/EOC [id number] Status is pending

## 2024-07-17 ENCOUNTER — Other Ambulatory Visit (HOSPITAL_COMMUNITY): Payer: Self-pay

## 2024-07-17 NOTE — Telephone Encounter (Signed)
 Pharmacy Patient Advocate Encounter  Received notification from [facility] that Prior Authorization for Lidocaine  5% patches has been closed   PA #/Case ID/Reference #: [id number]

## 2024-07-20 DIAGNOSIS — M19011 Primary osteoarthritis, right shoulder: Secondary | ICD-10-CM | POA: Diagnosis not present

## 2024-07-20 DIAGNOSIS — M94211 Chondromalacia, right shoulder: Secondary | ICD-10-CM | POA: Diagnosis not present

## 2024-07-20 DIAGNOSIS — G8918 Other acute postprocedural pain: Secondary | ICD-10-CM | POA: Diagnosis not present

## 2024-07-20 DIAGNOSIS — M75121 Complete rotator cuff tear or rupture of right shoulder, not specified as traumatic: Secondary | ICD-10-CM | POA: Diagnosis not present

## 2024-07-20 DIAGNOSIS — M25511 Pain in right shoulder: Secondary | ICD-10-CM | POA: Diagnosis not present

## 2024-07-20 DIAGNOSIS — M7521 Bicipital tendinitis, right shoulder: Secondary | ICD-10-CM | POA: Diagnosis not present

## 2024-07-20 DIAGNOSIS — M7541 Impingement syndrome of right shoulder: Secondary | ICD-10-CM | POA: Diagnosis not present

## 2024-07-20 DIAGNOSIS — S43431A Superior glenoid labrum lesion of right shoulder, initial encounter: Secondary | ICD-10-CM | POA: Diagnosis not present

## 2024-07-20 DIAGNOSIS — S46011A Strain of muscle(s) and tendon(s) of the rotator cuff of right shoulder, initial encounter: Secondary | ICD-10-CM | POA: Diagnosis not present

## 2024-07-21 ENCOUNTER — Other Ambulatory Visit: Payer: Self-pay | Admitting: Nurse Practitioner

## 2024-07-21 NOTE — Telephone Encounter (Unsigned)
 Copied from CRM [id number]. Topic: Clinical - Medication Refill >> Jul 21, 2024  8:37 AM Myrick T wrote: Patient took last dosage on Saturday  Medication: losartan -hydrochlorothiazide (HYZAAR) 100-12.5 MG tablet  Has the patient contacted their pharmacy? Yes (Agent: If no, request that the patient contact the pharmacy for the refill. If patient does not wish to contact the pharmacy document the reason why and proceed with request.) (Agent: If yes, when and what did the pharmacy advise?)  This is the patient's preferred pharmacy:  [id number] [place], [place] - [address] [place] [address] Phone: [phone] Fax: [phone]  Is this the correct pharmacy for this prescription? Yes  Has the prescription been filled recently? Yes  Is the patient out of the medication? Yes  Has the patient been seen for an appointment in the last year OR does the patient have an upcoming appointment? Yes  Can we respond through MyChart? No  Agent: Please be advised that Rx refills may take up to 3 business days. We ask that you follow-up with your pharmacy.

## 2024-07-22 ENCOUNTER — Other Ambulatory Visit: Payer: Self-pay | Admitting: Nurse Practitioner

## 2024-07-22 MED ORDER — LOSARTAN POTASSIUM-HCTZ 100-12.5 MG PO TABS
1.0000 | ORAL_TABLET | Freq: Every day | ORAL | 3 refills | Status: AC
Start: 2024-07-22 — End: ?

## 2024-07-22 NOTE — Progress Notes (Signed)
 Refills

## 2024-07-23 NOTE — Telephone Encounter (Signed)
 Refilled 07/22/24 # 90 with 3 refills. Requested Prescriptions  Refused Prescriptions Disp Refills   losartan -hydrochlorothiazide (HYZAAR) 100-12.5 MG tablet      Sig: Take 1 tablet by mouth daily.     Cardiovascular: ARB + Diuretic Combos Passed - 07/23/2024 11:05 AM      Passed - K in normal range and within 180 days    Potassium  Date Value Ref Range Status  06/26/2024 4.2 3.5 - 5.2 mmol/L Final         Passed - Na in normal range and within 180 days    Sodium  Date Value Ref Range Status  06/26/2024 136 134 - 144 mmol/L Final         Passed - Cr in normal range and within 180 days    Creatinine, Ser  Date Value Ref Range Status  06/26/2024 1.10 0.76 - 1.27 mg/dL Final         Passed - eGFR is 10 or above and within 180 days    GFR, Estimated  Date Value Ref Range Status  02/25/2024 >60 >60 mL/min Final    Comment:    (NOTE) Calculated using the CKD-EPI Creatinine Equation (2021)    eGFR  Date Value Ref Range Status  06/26/2024 78 >59 mL/min/1.73 Final         Passed - Patient is not pregnant      Passed - Last BP in normal range    BP Readings from Last 1 Encounters:  06/26/24 128/85         Passed - Valid encounter within last 6 months    Recent Outpatient Visits           3 weeks ago Type 2 diabetes mellitus in patient with obesity (HCC)   [place] [facility] [place], [place] T, NP   4 months ago Type 2 diabetes mellitus with obesity   [place] [facility] [place], Darice, NP   4 months ago Acute right-sided low back pain with right-sided sciatica   [place] [place], Stephan T, NP   6 months ago Type 2 diabetes mellitus with obesity   [place] [facility] [place], [place] T, NP   7 months ago Type 2 diabetes mellitus with obesity   [place] [facility] [place], Melanie DASEN, NP

## 2024-07-31 DIAGNOSIS — M25511 Pain in right shoulder: Secondary | ICD-10-CM | POA: Diagnosis not present

## 2024-08-01 NOTE — Patient Instructions (Signed)
 Insomnia Insomnia is a sleep disorder that makes it difficult to fall asleep or stay asleep. Insomnia can cause fatigue, low energy, difficulty concentrating, mood swings, and poor performance at work or school. There are three different ways to classify insomnia: Difficulty falling asleep. Difficulty staying asleep. Waking up too early in the morning. Any type of insomnia can be long-term (chronic) or short-term (acute). Both are common. Short-term insomnia usually lasts for 3 months or less. Chronic insomnia occurs at least three times a week for longer than 3 months. What are the causes? Insomnia may be caused by another condition, situation, or substance, such as: Having certain mental health conditions, such as anxiety and depression. Using caffeine, alcohol , tobacco, or drugs. Having gastrointestinal conditions, such as gastroesophageal reflux disease (GERD). Having certain medical conditions. These include: Asthma. Alzheimer's disease. Stroke. Chronic pain. An overactive thyroid  gland (hyperthyroidism). Other sleep disorders, such as restless legs syndrome and sleep apnea. Menopause. Sometimes, the cause of insomnia may not be known. What increases the risk? Risk factors for insomnia include: Gender. Females are affected more often than males. Age. Insomnia is more common as people get older. Stress and certain medical and mental health conditions. Lack of exercise. Having an irregular work schedule. This may include working night shifts and traveling between different time zones. What are the signs or symptoms? If you have insomnia, the main symptom is having trouble falling asleep or having trouble staying asleep. This may lead to other symptoms, such as: Feeling tired or having low energy. Feeling nervous about going to sleep. Not feeling rested in the morning. Having trouble concentrating. Feeling irritable, anxious, or depressed. How is this diagnosed? This condition  may be diagnosed based on: Your symptoms and medical history. Your health care provider may ask about: Your sleep habits. Any medical conditions you have. Your mental health. A physical exam. How is this treated? Treatment for insomnia depends on the cause. Treatment may focus on treating an underlying condition that is causing the insomnia. Treatment may also include: Medicines to help you sleep. Counseling or therapy. Lifestyle adjustments to help you sleep better. Follow these instructions at home: Eating and drinking  Limit or avoid alcohol , caffeinated beverages, and products that contain nicotine and tobacco, especially close to bedtime. These can disrupt your sleep. Do not eat a large meal or eat spicy foods right before bedtime. This can lead to digestive discomfort that can make it hard for you to sleep. Sleep habits  Keep a sleep diary to help you and your health care provider figure out what could be causing your insomnia. Write down: When you sleep. When you wake up during the night. How well you sleep and how rested you feel the next day. Any side effects of medicines you are taking. What you eat and drink. Make your bedroom a dark, comfortable place where it is easy to fall asleep. Put up shades or blackout curtains to block light from outside. Use a white noise machine to block noise. Keep the temperature cool. Limit screen use before bedtime. This includes: Not watching TV. Not using your smartphone, tablet, or computer. Stick to a routine that includes going to bed and waking up at the same times every day and night. This can help you fall asleep faster. Consider making a quiet activity, such as reading, part of your nighttime routine. Try to avoid taking naps during the day so that you sleep better at night. Get out of bed if you are still awake after  15 minutes of trying to sleep. Keep the lights down, but try reading or doing a quiet activity. When you feel  sleepy, go back to bed. General instructions Take over-the-counter and prescription medicines only as told by your health care provider. Exercise regularly as told by your health care provider. However, avoid exercising in the hours right before bedtime. Use relaxation techniques to manage stress. Ask your health care provider to suggest some techniques that may work well for you. These may include: Breathing exercises. Routines to release muscle tension. Visualizing peaceful scenes. Make sure that you drive carefully. Do not drive if you feel very sleepy. Keep all follow-up visits. This is important. Contact a health care provider if: You are tired throughout the day. You have trouble in your daily routine due to sleepiness. You continue to have sleep problems, or your sleep problems get worse. Get help right away if: You have thoughts about hurting yourself or someone else. Get help right away if you feel like you may hurt yourself or others, or have thoughts about taking your own life. Go to your nearest emergency room or: Call 911. Call the National Suicide Prevention Lifeline at 2232757840 or 988. This is open 24 hours a day. Text the Crisis Text Line at 657-529-4371. Summary Insomnia is a sleep disorder that makes it difficult to fall asleep or stay asleep. Insomnia can be long-term (chronic) or short-term (acute). Treatment for insomnia depends on the cause. Treatment may focus on treating an underlying condition that is causing the insomnia. Keep a sleep diary to help you and your health care provider figure out what could be causing your insomnia. This information is not intended to replace advice given to you by your health care provider. Make sure you discuss any questions you have with your health care provider. Document Revised: 08/07/2021 Document Reviewed: 08/07/2021 Elsevier Patient Education  2024 [employer].

## 2024-08-03 ENCOUNTER — Encounter: Payer: Self-pay | Admitting: Nurse Practitioner

## 2024-08-03 ENCOUNTER — Ambulatory Visit: Admitting: Nurse Practitioner

## 2024-08-03 VITALS — BP 108/73 | HR 88 | Temp 98.4°F | Resp 14 | Ht 67.8 in | Wt 213.8 lb

## 2024-08-03 DIAGNOSIS — F5101 Primary insomnia: Secondary | ICD-10-CM | POA: Diagnosis not present

## 2024-08-03 DIAGNOSIS — G4733 Obstructive sleep apnea (adult) (pediatric): Secondary | ICD-10-CM

## 2024-08-03 MED ORDER — ESZOPICLONE 2 MG PO TABS: 2.0000 mg | ORAL_TABLET | Freq: Every evening | ORAL | 1 refills | Status: DC | PRN

## 2024-08-03 MED ORDER — TIRZEPATIDE 7.5 MG/0.5ML ~~LOC~~ SOAJ: 7.5000 mg | SUBCUTANEOUS | 5 refills | Status: DC

## 2024-08-03 NOTE — Assessment & Plan Note (Signed)
 Chronic, ongoing.  Suspect some related to OSA, but he refuses to pursue another study at this time or look into new equipment due to cost. Will increase Lunesta  to 2 MG nightly and assess if benefit.  Monitor closely.  Ambien caused too much fatigue in past and hallucinations.  Recommend: ?Maintain a regular sleep schedule, particularly a regular wake-up time in the morning ?Try not to force sleep ?Avoid caffeinated beverages after lunch ?Avoid alcohol near bedtime (eg, late afternoon and evening)  ?Avoid smoking or other nicotine intake, particularly during the evening ?Adjust the bedroom environment as needed to decrease stimuli (eg, reduce ambient light, turn off the television or radio) ?Avoid prolonged use of light-emitting screens (laptops, tablets, smartphones, ebooks) before bedtime  ?Resolve concerns or worries before bedtime ?Exercise regularly for at least 20 minutes, preferably more than four to five hours prior to bedtime  ?Avoid daytime naps, especially if they are longer than 20 to 30 minutes or occur late in the day

## 2024-08-03 NOTE — Assessment & Plan Note (Addendum)
 Is not using CPAP, discussed with him the risks of no use.  Would benefit repeat study and different equipment, but refuses at this time due to cost.  For now he prefers not to pursue this. Could even consider evaluation for Inspire.

## 2024-08-03 NOTE — Progress Notes (Signed)
 BP 108/73 (BP Location: Left Arm, Patient Position: Sitting, Cuff Size: Large)   Pulse 88   Temp 98.4 F (36.9 C) (Oral)   Resp 14   Ht 5' 7.8 (1.722 m)   Wt 213 lb 12.8 oz (97 kg)   SpO2 98%   BMI 32.71 kg/m    Subjective:    Patient ID: Kenneth Cooper, male    DOB: 07/09/1966, 58 y.o.   MRN: [id number]  HPI: Kenneth Cooper is a 58 y.o. male  Chief Complaint  Patient presents with   Insomnia    Did try Lunesta  but did not help any. Tried 2 nights. Still up all night. Works 3rd shift.    INSOMNIA Follow-up today for starting Lunesta  at recent visit for sleep. He reports tried for 2 nights and this did not help.  No side effects.  Does have OSA, but not using CPAP.  Did not sleep well with this.  Works 3rd shift. Duration: years Satisfied with sleep quality: yes Difficulty falling asleep: yes Difficulty staying asleep: yes Waking a few hours after sleep onset: no Early morning awakenings: no Daytime hypersomnolence: no Wakes feeling refreshed: sometimes Good sleep hygiene: yes Apnea: yes Snoring: no Depressed/anxious mood: no Recent stress: no Restless legs/nocturnal leg cramps: no Chronic pain/arthritis: no History of sleep study: yes Treatments attempted: Melatonin, Magnesium, Trazodone , Lunesta , and Ambien (caused hallucinations)  Relevant past medical, surgical, family and social history reviewed and updated as indicated. Interim medical history since our last visit reviewed. Allergies and medications reviewed and updated.  Review of Systems  Constitutional:  Negative for activity change, appetite change, diaphoresis, fatigue and fever.  Respiratory:  Negative for cough, chest tightness, shortness of breath and wheezing.   Cardiovascular:  Negative for chest pain, palpitations and leg swelling.  Gastrointestinal: Negative.   Neurological: Negative.   Psychiatric/Behavioral:  Positive for sleep disturbance. Negative for decreased concentration, self-injury and  suicidal ideas. The patient is not nervous/anxious.     Per HPI unless specifically indicated above     Objective:    BP 108/73 (BP Location: Left Arm, Patient Position: Sitting, Cuff Size: Large)   Pulse 88   Temp 98.4 F (36.9 C) (Oral)   Resp 14   Ht 5' 7.8 (1.722 m)   Wt 213 lb 12.8 oz (97 kg)   SpO2 98%   BMI 32.71 kg/m   Wt Readings from Last 3 Encounters:  08/03/24 213 lb 12.8 oz (97 kg)  06/26/24 215 lb (97.5 kg)  03/25/24 215 lb (97.5 kg)    Physical Exam Vitals and nursing note reviewed.  Constitutional:      General: He is awake. He is not in acute distress.    Appearance: He is well-developed and well-groomed. He is obese. He is not ill-appearing or toxic-appearing.  HENT:     Head: Normocephalic.     Right Ear: Hearing and external ear normal.     Left Ear: Hearing and external ear normal.  Eyes:     General: Lids are normal.     Extraocular Movements: Extraocular movements intact.     Conjunctiva/sclera: Conjunctivae normal.  Neck:     Thyroid: No thyromegaly.     Vascular: No carotid bruit.  Cardiovascular:     Rate and Rhythm: Normal rate and regular rhythm.     Heart sounds: Normal heart sounds. No murmur heard.    No gallop.  Pulmonary:     Effort: No accessory muscle usage or respiratory distress.  Breath sounds: Normal breath sounds.  Abdominal:     General: Bowel sounds are normal. There is no distension.     Palpations: Abdomen is soft.     Tenderness: There is no abdominal tenderness.  Musculoskeletal:     Cervical back: Full passive range of motion without pain.     Right lower leg: No edema.     Left lower leg: No edema.  Lymphadenopathy:     Cervical: No cervical adenopathy.  Skin:    General: Skin is warm.     Capillary Refill: Capillary refill takes less than 2 seconds.  Neurological:     Mental Status: He is alert and oriented to person, place, and time.     Deep Tendon Reflexes: Reflexes are normal and symmetric.      Reflex Scores:      Brachioradialis reflexes are 2+ on the right side and 2+ on the left side.      Patellar reflexes are 2+ on the right side and 2+ on the left side. Psychiatric:        Attention and Perception: Attention normal.        Mood and Affect: Mood normal.        Speech: Speech normal.        Behavior: Behavior normal. Behavior is cooperative.        Thought Content: Thought content normal.     Results for orders placed or performed in visit on 06/26/24  Bayer DCA Hb A1c Waived   Collection Time: 06/26/24  3:28 PM  Result Value Ref Range   HB A1C (BAYER DCA - WAIVED) 9.1 (H) 4.8 - 5.6 %  Comprehensive metabolic panel with GFR   Collection Time: 06/26/24  3:28 PM  Result Value Ref Range   Glucose 247 (H) 70 - 99 mg/dL   BUN 16 6 - 24 mg/dL   Creatinine, Ser 8.89 0.76 - 1.27 mg/dL   eGFR 78 >40 fO/fpw/8.26   BUN/Creatinine Ratio 15 9 - 20   Sodium 136 134 - 144 mmol/L   Potassium 4.2 3.5 - 5.2 mmol/L   Chloride 96 96 - 106 mmol/L   CO2 23 20 - 29 mmol/L   Calcium  10.1 8.7 - 10.2 mg/dL   Total Protein 7.8 6.0 - 8.5 g/dL   Albumin 4.7 3.8 - 4.9 g/dL   Globulin, Total 3.1 1.5 - 4.5 g/dL   Bilirubin Total 1.1 0.0 - 1.2 mg/dL   Alkaline Phosphatase 89 47 - 123 IU/L   AST 17 0 - 40 IU/L   ALT 23 0 - 44 IU/L  Lipid Panel w/o Chol/HDL Ratio   Collection Time: 06/26/24  3:28 PM  Result Value Ref Range   Cholesterol, Total 113 100 - 199 mg/dL   Triglycerides 78 0 - 149 mg/dL   HDL 48 >60 mg/dL   VLDL Cholesterol Cal 16 5 - 40 mg/dL   LDL Chol Calc (NIH) 49 0 - 99 mg/dL      Assessment & Plan:   Problem List Items Addressed This Visit       Respiratory   OSA on CPAP   Is not using CPAP, discussed with him the risks of no use.  Would benefit repeat study and different equipment, but refuses at this time due to cost.  For now he prefers not to pursue this. Could even consider evaluation for Inspire.        Other   Insomnia - Primary   Chronic, ongoing.   Suspect  some related to OSA, but he refuses to pursue another study at this time or look into new equipment due to cost. Will increase Lunesta  to 2 MG nightly and assess if benefit.  Monitor closely.  Ambien caused too much fatigue in past and hallucinations.  Recommend: ?Maintain a regular sleep schedule, particularly a regular wake-up time in the morning ?Try not to force sleep ?Avoid caffeinated beverages after lunch ?Avoid alcohol near bedtime (eg, late afternoon and evening)  ?Avoid smoking or other nicotine intake, particularly during the evening ?Adjust the bedroom environment as needed to decrease stimuli (eg, reduce ambient light, turn off the television or radio) ?Avoid prolonged use of light-emitting screens (laptops, tablets, smartphones, ebooks) before bedtime  ?Resolve concerns or worries before bedtime ?Exercise regularly for at least 20 minutes, preferably more than four to five hours prior to bedtime  ?Avoid daytime naps, especially if they are longer than 20 to 30 minutes or occur late in the day          Follow up plan: Return in about 8 weeks (around 09/28/2024) for T2DM, HTN/HLD, INSOMNIA, OSA.

## 2024-08-04 DIAGNOSIS — M25511 Pain in right shoulder: Secondary | ICD-10-CM | POA: Diagnosis not present

## 2024-08-13 DIAGNOSIS — M25511 Pain in right shoulder: Secondary | ICD-10-CM | POA: Diagnosis not present

## 2024-08-17 DIAGNOSIS — M25511 Pain in right shoulder: Secondary | ICD-10-CM | POA: Diagnosis not present

## 2024-08-24 DIAGNOSIS — M25511 Pain in right shoulder: Secondary | ICD-10-CM | POA: Diagnosis not present

## 2024-09-01 ENCOUNTER — Other Ambulatory Visit: Payer: Self-pay | Admitting: Nurse Practitioner

## 2024-09-01 DIAGNOSIS — M25511 Pain in right shoulder: Secondary | ICD-10-CM | POA: Diagnosis not present

## 2024-09-01 NOTE — Telephone Encounter (Signed)
 Copied from CRM [id number]. Topic: Clinical - Medication Refill >> Sep 01, 2024 10:52 AM Hadassah PARAS wrote: Medication: tirzepatide  (MOUNJARO ) 7.5 MG/0.5ML Pen  Has the patient contacted their pharmacy? No (Agent: If no, request that the patient contact the pharmacy for the refill. If patient does not wish to contact the pharmacy document the reason why and proceed with request.) (Agent: If yes, when and what did the pharmacy advise?)   [id number] [place] [place] - [address] [place] [address] Phone: [phone]  Fax: [phone]   This is the patient's preferred pharmacy: No Pharmacies Listed Is this the correct pharmacy for this prescription? Yes If no, delete pharmacy and type the correct one.   Has the prescription been filled recently? No  Is the patient out of the medication? No  Has the patient been seen for an appointment in the last year OR does the patient have an upcoming appointment? Was advised he would be out on Mounjaro  after finishing up Ozempic   Can we respond through MyChart?   Agent: Please be advised that Rx refills may take up to 3 business days. We ask that you follow-up with your pharmacy.

## 2024-09-01 NOTE — Telephone Encounter (Signed)
 Copied from CRM [id number]. Topic: Clinical - Medication Refill >> Sep 01, 2024 10:52 AM Hadassah PARAS wrote: Medication: tirzepatide  (MOUNJARO ) 7.5 MG/0.5ML Pen  Has the patient contacted their pharmacy? No (Agent: If no, request that the patient contact the pharmacy for the refill. If patient does not wish to contact the pharmacy document the reason why and proceed with request.) (Agent: If yes, when and what did the pharmacy advise?)   [id number] [place] [place] - [address] [place] [address] Phone: [phone]  Fax: [phone]   This is the patient's preferred pharmacy: No Pharmacies Listed Is this the correct pharmacy for this prescription? Yes If no, delete pharmacy and type the correct one.   Has the prescription been filled recently? No  Is the patient out of the medication? No  Has the patient been seen for an appointment in the last year OR does the patient have an upcoming appointment? Was advised he would be out on Mounjaro  after finishing up Ozempic   Can we respond through MyChart? No  Agent: Please be advised that Rx refills may take up to 3 business days. We ask that you follow-up with your pharmacy.

## 2024-09-02 NOTE — Telephone Encounter (Signed)
 Already requested in a separate encounter on 09/01/24, pending approval by provider.

## 2024-09-02 NOTE — Telephone Encounter (Signed)
 Requested medication (s) are due for refill today: na   Requested medication (s) are on the active medication list: yes   Last refill:  08/03/24 #2 ml 5 refills  Future visit scheduled: yes 09/29/24  Notes to clinic:  medication not assigned to a protocol. Do you want to refill Rx?     Requested Prescriptions  Pending Prescriptions Disp Refills   tirzepatide  (MOUNJARO ) 7.5 MG/0.5ML Pen 2 mL 5    Sig: Inject 7.5 mg into the skin once a week.     Off-Protocol Failed - 09/02/2024  3:17 PM      Failed - Medication not assigned to a protocol, review manually.      Passed - Valid encounter within last 12 months    Recent Outpatient Visits           1 month ago Primary insomnia   [place] [facility] [place], Micco T, NP   2 months ago Type 2 diabetes mellitus in patient with obesity (HCC)   [place] [facility] [place], McArthur T, NP   5 months ago Type 2 diabetes mellitus with obesity   [place] [facility] Melvin Pao, NP   6 months ago Acute right-sided low back pain with right-sided sciatica   [facility] [place], Melanie T, NP   7 months ago Type 2 diabetes mellitus with obesity   [place] [facility] [place], Melanie DASEN, NP

## 2024-09-04 MED ORDER — TIRZEPATIDE 7.5 MG/0.5ML ~~LOC~~ SOAJ: 7.5000 mg | SUBCUTANEOUS | 5 refills | Status: DC

## 2024-09-09 DIAGNOSIS — M25511 Pain in right shoulder: Secondary | ICD-10-CM | POA: Diagnosis not present

## 2024-09-26 NOTE — Patient Instructions (Incomplete)

## 2024-09-29 ENCOUNTER — Ambulatory Visit: Admitting: Nurse Practitioner

## 2024-09-29 DIAGNOSIS — E119 Type 2 diabetes mellitus without complications: Secondary | ICD-10-CM

## 2024-09-29 DIAGNOSIS — E1169 Type 2 diabetes mellitus with other specified complication: Secondary | ICD-10-CM

## 2024-09-29 DIAGNOSIS — E669 Obesity, unspecified: Secondary | ICD-10-CM

## 2024-09-29 DIAGNOSIS — E1159 Type 2 diabetes mellitus with other circulatory complications: Secondary | ICD-10-CM

## 2024-09-29 DIAGNOSIS — G4733 Obstructive sleep apnea (adult) (pediatric): Secondary | ICD-10-CM

## 2024-10-03 NOTE — Patient Instructions (Signed)
 Be Involved in Caring For Your Health:  Taking Medications When medications are taken as directed, they can greatly improve your health. But if they are not taken as prescribed, they may not work. In some cases, not taking them correctly can be harmful. To help ensure your treatment remains effective and safe, understand your medications and how to take them. Bring your medications to each visit for review by your provider.  Your lab results, notes, and after visit summary will be available on My Chart. We strongly encourage you to use this feature. If lab results are abnormal the clinic will contact you with the appropriate steps. If the clinic does not contact you assume the results are satisfactory. You can always view your results on My Chart. If you have questions regarding your health or results, please contact the clinic during office hours. You can also ask questions on My Chart.  We at [facility] are grateful that you chose [place] to provide your care. We strive to provide evidence-based and compassionate care and are always looking for feedback. If you get a survey from the clinic please complete this so we can hear your opinions.  Diabetes Mellitus and Foot Care Diabetes, also called diabetes mellitus, may cause problems with your feet and legs because of poor blood flow (circulation). Poor circulation may make your skin: Become thinner and drier. Break more easily. Heal more slowly. Peel and crack. You may also have nerve damage (neuropathy). This can cause decreased feeling in your legs and feet. This means that you may not notice minor injuries to your feet that could lead to more serious problems. Finding and treating problems early is the best way to prevent future foot problems. How to care for your feet Foot hygiene  Wash your feet daily with warm water and mild soap. Do not use hot water. Then, pat your feet and the areas between your toes until they are fully dry. Do  not soak your feet. This can dry your skin. Trim your toenails straight across. Do not dig under them or around the cuticle. File the edges of your nails with an emery board or nail file. Apply a moisturizing lotion or petroleum jelly to the skin on your feet and to dry, brittle toenails. Use lotion that does not contain alcohol and is unscented. Do not apply lotion between your toes. Shoes and socks Wear clean socks or stockings every day. Make sure they are not too tight. Do not wear knee-high stockings. These may decrease blood flow to your legs. Wear shoes that fit well and have enough cushioning. Always look in your shoes before you put them on to be sure there are no objects inside. To break in new shoes, wear them for just a few hours a day. This prevents injuries on your feet. Wounds, scrapes, corns, and calluses  Check your feet daily for blisters, cuts, bruises, sores, and redness. If you cannot see the bottom of your feet, use a mirror or ask someone for help. Do not cut off corns or calluses or try to remove them with medicine. If you find a minor scrape, cut, or break in the skin on your feet, keep it and the skin around it clean and dry. You may clean these areas with mild soap and water. Do not clean the area with peroxide, alcohol, or iodine. If you have a wound, scrape, corn, or callus on your foot, look at it several times a day to make sure it  is healing and not infected. Check for: Redness, swelling, or pain. Fluid or blood. Warmth. Pus or a bad smell. General tips Do not cross your legs. This may decrease blood flow to your feet. Do not use heating pads or hot water bottles on your feet. They may burn your skin. If you have lost feeling in your feet or legs, you may not know this is happening until it is too late. Protect your feet from hot and cold by wearing shoes, such as at the beach or on hot pavement. Schedule a complete foot exam at least once a year or more often if  you have foot problems. Report any cuts, sores, or bruises to your health care provider right away. Where to find more information American Diabetes Association: diabetes.org Association of Diabetes Care & Education Specialists: diabeteseducator.org Contact a health care provider if: You have a condition that increases your risk of infection, and you have any cuts, sores, or bruises on your feet. You have an injury that is not healing. You have redness on your legs or feet. You feel burning or tingling in your legs or feet. You have pain or cramps in your legs and feet. Your legs or feet are numb. Your feet always feel cold. You have pain around any toenails. Get help right away if: You have a wound, scrape, corn, or callus on your foot and: You have signs of infection. You have a fever. You have a red line going up your leg. This information is not intended to replace advice given to you by your health care provider. Make sure you discuss any questions you have with your health care provider. Document Revised: 02/28/2022 Document Reviewed: 02/28/2022 Elsevier Patient Education  2024 [employer].

## 2024-10-06 ENCOUNTER — Ambulatory Visit (INDEPENDENT_AMBULATORY_CARE_PROVIDER_SITE_OTHER): Admitting: Nurse Practitioner

## 2024-10-06 ENCOUNTER — Encounter: Payer: Self-pay | Admitting: Nurse Practitioner

## 2024-10-06 VITALS — BP 128/80 | HR 85 | Temp 97.8°F | Ht 67.6 in | Wt 212.8 lb

## 2024-10-06 DIAGNOSIS — G4733 Obstructive sleep apnea (adult) (pediatric): Secondary | ICD-10-CM

## 2024-10-06 DIAGNOSIS — M79641 Pain in right hand: Secondary | ICD-10-CM | POA: Diagnosis not present

## 2024-10-06 DIAGNOSIS — E119 Type 2 diabetes mellitus without complications: Secondary | ICD-10-CM

## 2024-10-06 DIAGNOSIS — E1159 Type 2 diabetes mellitus with other circulatory complications: Secondary | ICD-10-CM | POA: Diagnosis not present

## 2024-10-06 DIAGNOSIS — F5101 Primary insomnia: Secondary | ICD-10-CM | POA: Diagnosis not present

## 2024-10-06 DIAGNOSIS — E1169 Type 2 diabetes mellitus with other specified complication: Secondary | ICD-10-CM | POA: Diagnosis not present

## 2024-10-06 DIAGNOSIS — E785 Hyperlipidemia, unspecified: Secondary | ICD-10-CM | POA: Diagnosis not present

## 2024-10-06 DIAGNOSIS — I152 Hypertension secondary to endocrine disorders: Secondary | ICD-10-CM

## 2024-10-06 DIAGNOSIS — E669 Obesity, unspecified: Secondary | ICD-10-CM | POA: Diagnosis not present

## 2024-10-06 DIAGNOSIS — Z7985 Long-term (current) use of injectable non-insulin antidiabetic drugs: Secondary | ICD-10-CM

## 2024-10-06 LAB — BAYER DCA HB A1C WAIVED: HB A1C (BAYER DCA - WAIVED): 8.5 % — ABNORMAL HIGH (ref 4.8–5.6)

## 2024-10-06 MED ORDER — TRAZODONE HCL 150 MG PO TABS: 150.0000 mg | ORAL_TABLET | Freq: Every evening | ORAL | 1 refills | Status: AC | PRN

## 2024-10-06 MED ORDER — TIRZEPATIDE 10 MG/0.5ML ~~LOC~~ SOAJ: 10.0000 mg | SUBCUTANEOUS | 4 refills | Status: AC

## 2024-10-06 NOTE — Assessment & Plan Note (Signed)
 Chronic, ongoing.  Continue current medication regimen and adjust as needed. Lipid panel today.

## 2024-10-06 NOTE — Progress Notes (Signed)
 "  BP 128/80 (BP Location: Left Arm, Patient Position: Sitting, Cuff Size: Normal)   Pulse 85   Temp 97.8 F (36.6 C) (Oral)   Ht 5' 7.6 (1.717 m)   Wt 212 lb 12.8 oz (96.5 kg)   SpO2 97%   BMI 32.74 kg/m    Subjective:    Patient ID: Kenneth Cooper, male    DOB: 22-Feb-1966, 59 y.o.   MRN: [id number]  HPI: Kenneth Cooper is a 59 y.o. male  Chief Complaint  Patient presents with   Diabetes    No eye exam per patient, requesting referral if possible    Hyperlipidemia   Hypertension   Hand Pain    Patient states he has been having trouble bending his fingers all the way into his palm. States his fingers and knuckles become stiff and cramp up when he does this.    DIABETES A1c in October was 9.1%. Taking Mounjaro  7.5 MG as Ozempic  was not getting him to goal. Does notice decrease in appetite with this. He denies any ADR with this.  HISTORY: Did not tolerate Jardiance, made him very tired. Metformin caused him to feel tired and did not feel good. Took Actos and Glipizide in past, does not recall these. Hypoglycemic episodes:no Polydipsia/polyuria: no Visual disturbance: no Chest pain: no Paresthesias: no Glucose Monitoring: no  Accucheck frequency: Not Checking  Fasting glucose:  Post prandial:  Evening:  Before meals: Taking Insulin?: no  Long acting insulin:  Short acting insulin: Blood Pressure Monitoring: not checking Retinal Examination: Not up to Date -- [facility] Foot Exam: Up To Date Diabetic Education: Not Completed Pneumovax: Up to Date Influenza: Up to Date Aspirin: no   HYPERTENSION / HYPERLIPIDEMIA Takes Amlodipine , Losartan /HCTZ, and Rosuvastatin .  Satisfied with current treatment? yes Duration of hypertension: chronic BP monitoring frequency: not checking BP range:  BP medication side effects: no Duration of hyperlipidemia: chronic Cholesterol medication side effects: no Cholesterol supplements: none Medication compliance: good  compliance Aspirin: no Recent stressors: no Recurrent headaches: no Visual changes: no Palpitations: no Dyspnea: no Chest pain: no Lower extremity edema: no Dizzy/lightheaded: no   HAND PAIN Is having hand pain for months, right hand. He did wear sling for a period due to shoulder, back in November 2025. Right hand dominant.  Ortho reported they would get him into hand specialist next specialist. Duration: months Involved hand: right Mechanism of injury: wore sling for long period Location: diffuse Onset: gradual Severity: 7/10  Quality: sharp, dull, aching, and throbbing Frequency: intermittent Radiation: no Aggravating factors: bending fingers to fist Alleviating factors: stretching Treatments attempted: stretching -- cannot hold anything due to pain, Flexeril  Relief with NSAIDs?: No NSAIDs Taken Weakness: yes Numbness: no Redness: no Swelling:yes Bruising: no Fevers: no   INSOMNIA Takes Trazodone , but does not help 100%. Lunesta  did not offer benefit.  Does have OSA, but is not using CPAP, did not sleep. Duration: years Satisfied with sleep quality: yes Difficulty falling asleep: yes Difficulty staying asleep: yes Waking a few hours after sleep onset: no Early morning awakenings: no Daytime hypersomnolence: no Wakes feeling refreshed: sometimes Good sleep hygiene: yes Apnea: yes Snoring: no Depressed/anxious mood: no Recent stress: no Restless legs/nocturnal leg cramps: no Chronic pain/arthritis: no History of sleep study: yes Treatments attempted: Melatonin, Magnesium, Trazodone , Ambien, Lunesta      10/06/2024    1:32 PM 08/03/2024    1:22 PM 06/26/2024    3:44 PM 03/25/2024    3:38 PM 03/03/2024  8:36 AM  Depression screen PHQ 2/9  Decreased Interest 2 0 2 0 1  Down, Depressed, Hopeless 0 0 0 1 0  PHQ - 2 Score 2 0 2 1 1   Altered sleeping 3 0 2 1 2   Tired, decreased energy 0 0 2 1 2   Change in appetite 2 0 3 2 0  Feeling bad or failure about  yourself  0 0 0 0 1  Trouble concentrating 0 0 0 0 0  Moving slowly or fidgety/restless 0 0 0 0 0  Suicidal thoughts 0 0 0 0 0  PHQ-9 Score 7 0 9  5  6    Difficult doing work/chores Somewhat difficult  Not difficult at all Not difficult at all Somewhat difficult     Data saved with a previous flowsheet row definition       10/06/2024    1:31 PM 08/03/2024    1:23 PM 06/26/2024    3:45 PM 03/25/2024    3:39 PM  GAD 7 : Generalized Anxiety Score  Nervous, Anxious, on Edge 0 0  0  0   Control/stop worrying 2 0  1  1   Worry too much - different things 0 0  0  1   Trouble relaxing 0 0  0  1   Restless 0 0  0  0   Easily annoyed or irritable 0 0  1  0   Afraid - awful might happen 0 0  0  0   Total GAD 7 Score 2 0 2 3  Anxiety Difficulty Not difficult at all  Not difficult at all Not difficult at all     Data saved with a previous flowsheet row definition       Relevant past medical, surgical, family and social history reviewed and updated as indicated. Interim medical history since our last visit reviewed. Allergies and medications reviewed and updated.  Review of Systems  Constitutional:  Negative for activity change, appetite change, diaphoresis, fatigue and fever.  Respiratory:  Negative for cough, chest tightness, shortness of breath and wheezing.   Cardiovascular:  Negative for chest pain, palpitations and leg swelling.  Gastrointestinal: Negative.   Neurological: Negative.   Psychiatric/Behavioral:  Positive for sleep disturbance. Negative for decreased concentration, self-injury and suicidal ideas. The patient is not nervous/anxious.     Per HPI unless specifically indicated above     Objective:    BP 128/80 (BP Location: Left Arm, Patient Position: Sitting, Cuff Size: Normal)   Pulse 85   Temp 97.8 F (36.6 C) (Oral)   Ht 5' 7.6 (1.717 m)   Wt 212 lb 12.8 oz (96.5 kg)   SpO2 97%   BMI 32.74 kg/m   Wt Readings from Last 3 Encounters:  10/06/24 212 lb 12.8  oz (96.5 kg)  08/03/24 213 lb 12.8 oz (97 kg)  06/26/24 215 lb (97.5 kg)    Physical Exam Vitals and nursing note reviewed.  Constitutional:      General: He is awake. He is not in acute distress.    Appearance: He is well-developed and well-groomed. He is obese. He is not ill-appearing or toxic-appearing.  HENT:     Head: Normocephalic.     Right Ear: Hearing and external ear normal.     Left Ear: Hearing and external ear normal.  Eyes:     General: Lids are normal.     Extraocular Movements: Extraocular movements intact.     Conjunctiva/sclera: Conjunctivae normal.  Neck:  Thyroid: No thyromegaly.     Vascular: No carotid bruit.  Cardiovascular:     Rate and Rhythm: Normal rate and regular rhythm.     Heart sounds: Normal heart sounds. No murmur heard.    No gallop.  Pulmonary:     Effort: No accessory muscle usage or respiratory distress.     Breath sounds: Normal breath sounds.  Abdominal:     General: Bowel sounds are normal. There is no distension.     Palpations: Abdomen is soft.     Tenderness: There is no abdominal tenderness.  Musculoskeletal:     Right hand: Swelling (mild) present. No tenderness or bony tenderness. Decreased range of motion (cannot make a full fist). Decreased strength of finger abduction. Normal sensation. Normal pulse.     Left hand: Normal.     Cervical back: Full passive range of motion without pain.     Right lower leg: No edema.     Left lower leg: No edema.  Lymphadenopathy:     Cervical: No cervical adenopathy.  Skin:    General: Skin is warm.     Capillary Refill: Capillary refill takes less than 2 seconds.  Neurological:     Mental Status: He is alert and oriented to person, place, and time.     Deep Tendon Reflexes: Reflexes are normal and symmetric.     Reflex Scores:      Brachioradialis reflexes are 2+ on the right side and 2+ on the left side.      Patellar reflexes are 2+ on the right side and 2+ on the left  side. Psychiatric:        Attention and Perception: Attention normal.        Mood and Affect: Mood normal.        Speech: Speech normal.        Behavior: Behavior normal. Behavior is cooperative.        Thought Content: Thought content normal.     Results for orders placed or performed in visit on 06/26/24  Bayer DCA Hb A1c Waived   Collection Time: 06/26/24  3:28 PM  Result Value Ref Range   HB A1C (BAYER DCA - WAIVED) 9.1 (H) 4.8 - 5.6 %  Comprehensive metabolic panel with GFR   Collection Time: 06/26/24  3:28 PM  Result Value Ref Range   Glucose 247 (H) 70 - 99 mg/dL   BUN 16 6 - 24 mg/dL   Creatinine, Ser 8.89 0.76 - 1.27 mg/dL   eGFR 78 >40 fO/fpw/8.26   BUN/Creatinine Ratio 15 9 - 20   Sodium 136 134 - 144 mmol/L   Potassium 4.2 3.5 - 5.2 mmol/L   Chloride 96 96 - 106 mmol/L   CO2 23 20 - 29 mmol/L   Calcium  10.1 8.7 - 10.2 mg/dL   Total Protein 7.8 6.0 - 8.5 g/dL   Albumin 4.7 3.8 - 4.9 g/dL   Globulin, Total 3.1 1.5 - 4.5 g/dL   Bilirubin Total 1.1 0.0 - 1.2 mg/dL   Alkaline Phosphatase 89 47 - 123 IU/L   AST 17 0 - 40 IU/L   ALT 23 0 - 44 IU/L  Lipid Panel w/o Chol/HDL Ratio   Collection Time: 06/26/24  3:28 PM  Result Value Ref Range   Cholesterol, Total 113 100 - 199 mg/dL   Triglycerides 78 0 - 149 mg/dL   HDL 48 >60 mg/dL   VLDL Cholesterol Cal 16 5 - 40 mg/dL   LDL Chol Calc (  NIH) 49 0 - 99 mg/dL      Assessment & Plan:   Problem List Items Addressed This Visit       Cardiovascular and Mediastinum   Hypertension associated with diabetes (HCC)   Chronic, stable.  BP initially elevated, but repeat at goal. Recommend he monitor BP at least a few mornings a week at home and document.  DASH diet at home.  Continue current medication regimen and adjust as needed.  Labs today: CMP.  Could increase Amlodipine  to 10 MG if needed in future.      Relevant Medications   tirzepatide  (MOUNJARO ) 10 MG/0.5ML Pen   Other Relevant Orders   Bayer DCA Hb A1c Waived    Comprehensive metabolic panel with GFR   Microalbumin, Urine Waived     Respiratory   OSA on CPAP   Is not using CPAP, discussed with him the risks of no use.  Would benefit repeat study and different equipment, but refuses at this time due to cost.  For now he prefers not to pursue this. Could even consider evaluation for [facility]. Suspect much of his insomnia is related to his OSA.        Endocrine   Type 2 diabetes mellitus in patient with obesity (HCC) - Primary   Chronic, ongoing. A1c today 8.5%, down from 9.1% but still above goal.  Discussed with him at length.  Did not tolerate Jardiance or Metformin in the past + recalls side effects with Glipizide and Actos.  Recommend he check blood sugars twice a day.  Provided him with sheet to document sugars on with goals at top for sugar levels. Urine ALB 18 Jan 2024, recheck today. - Increase Mounjaro  to 10 MG weekly as is offering benefit to sugar levels and weight. Could consider Glipizide in future at low dose or retrial SGLT2. Discussed with him if cost becomes and issue to reach out to PCP and will place a referral to PharmD. - Recommend he check BS daily fasting in morning with goal <130. - Foot exam up to date. Eye exam needed. - Vaccinations up to date. - Statin on board and ARB.      Relevant Medications   tirzepatide  (MOUNJARO ) 10 MG/0.5ML Pen   Other Relevant Orders   Bayer DCA Hb A1c Waived   Microalbumin, Urine Waived   Hyperlipidemia associated with type 2 diabetes mellitus (HCC)   Chronic, ongoing.  Continue current medication regimen and adjust as needed.  Lipid panel today.      Relevant Medications   tirzepatide  (MOUNJARO ) 10 MG/0.5ML Pen   Other Relevant Orders   Bayer DCA Hb A1c Waived   Comprehensive metabolic panel with GFR   Lipid Panel w/o Chol/HDL Ratio   Diabetes mellitus treated with injections of non-insulin medication (HCC)   Refer to diabetes with obesity plan of care.      Relevant Medications    tirzepatide  (MOUNJARO ) 10 MG/0.5ML Pen   Other Relevant Orders   Bayer DCA Hb A1c Waived   Microalbumin, Urine Waived     Other   Right hand pain   Dominant hand. Will continue to collaborate with ortho who is going to get him in with hand specialist per patient report. This would be beneficial. At this time recommend ice to area for 15 minutes as needed. Tylenol  can take 1000 MG in morning and 1000 MG at night to help prevent pain. Voltaren gel as needed. Perform home hand PT with soft ball and gentle stretching.  Obesity (BMI 30-39.9)   BMI 32.74.  Recommended eating smaller high protein, low fat meals more frequently and exercising 30 mins a day 5 times a week with a goal of 10-15lb weight loss in the next 3 months. Patient voiced their understanding and motivation to adhere to these recommendations.       Relevant Medications   tirzepatide  (MOUNJARO ) 10 MG/0.5ML Pen   Insomnia   Chronic, ongoing.  Suspect some related to OSA, but he refuses to pursue another study at this time or look into new equipment due to cost. Multiple medications have not offered benefit. Returning to Trazodone  and will trial 150 MG dosing.  Monitor closely.  Ambien caused too much fatigue in past and hallucinations.  Recommend: ?Maintain a regular sleep schedule, particularly a regular wake-up time in the morning ?Try not to force sleep ?Avoid caffeinated beverages after lunch ?Avoid alcohol near bedtime (eg, late afternoon and evening)  ?Avoid smoking or other nicotine intake, particularly during the evening ?Adjust the bedroom environment as needed to decrease stimuli (eg, reduce ambient light, turn off the television or radio) ?Avoid prolonged use of light-emitting screens (laptops, tablets, smartphones, ebooks) before bedtime  ?Resolve concerns or worries before bedtime ?Exercise regularly for at least 20 minutes, preferably more than four to five hours prior to bedtime  ?Avoid daytime naps, especially  if they are longer than 20 to 30 minutes or occur late in the day         Follow up plan: Return in about 3 months (around 01/04/2025) for T2DM, HTN/HLD, INSOMNIA.      "

## 2024-10-06 NOTE — Assessment & Plan Note (Signed)
 Chronic, ongoing.  Suspect some related to OSA, but he refuses to pursue another study at this time or look into new equipment due to cost. Multiple medications have not offered benefit. Returning to Trazodone  and will trial 150 MG dosing.  Monitor closely.  Ambien caused too much fatigue in past and hallucinations.  Recommend: ?Maintain a regular sleep schedule, particularly a regular wake-up time in the morning ?Try not to force sleep ?Avoid caffeinated beverages after lunch ?Avoid alcohol near bedtime (eg, late afternoon and evening)  ?Avoid smoking or other nicotine intake, particularly during the evening ?Adjust the bedroom environment as needed to decrease stimuli (eg, reduce ambient light, turn off the television or radio) ?Avoid prolonged use of light-emitting screens (laptops, tablets, smartphones, ebooks) before bedtime  ?Resolve concerns or worries before bedtime ?Exercise regularly for at least 20 minutes, preferably more than four to five hours prior to bedtime  ?Avoid daytime naps, especially if they are longer than 20 to 30 minutes or occur late in the day

## 2024-10-06 NOTE — Assessment & Plan Note (Signed)
 Chronic, stable.  BP initially elevated, but repeat at goal. Recommend he monitor BP at least a few mornings a week at home and document.  DASH diet at home.  Continue current medication regimen and adjust as needed.  Labs today: CMP.  Could increase Amlodipine  to 10 MG if needed in future.

## 2024-10-06 NOTE — Assessment & Plan Note (Signed)
 Refer to diabetes with obesity plan of care.

## 2024-10-06 NOTE — Assessment & Plan Note (Signed)
 BMI 32.74.  Recommended eating smaller high protein, low fat meals more frequently and exercising 30 mins a day 5 times a week with a goal of 10-15lb weight loss in the next 3 months. Patient voiced their understanding and motivation to adhere to these recommendations.

## 2024-10-06 NOTE — Assessment & Plan Note (Signed)
 Is not using CPAP, discussed with him the risks of no use.  Would benefit repeat study and different equipment, but refuses at this time due to cost.  For now he prefers not to pursue this. Could even consider evaluation for [facility]. Suspect much of his insomnia is related to his OSA.

## 2024-10-06 NOTE — Assessment & Plan Note (Addendum)
 Dominant hand. Will continue to collaborate with ortho who is going to get him in with hand specialist per patient report. This would be beneficial. At this time recommend ice to area for 15 minutes as needed. Tylenol  can take 1000 MG in morning and 1000 MG at night to help prevent pain. Voltaren gel as needed. Perform home hand PT with soft ball and gentle stretching.

## 2024-10-06 NOTE — Assessment & Plan Note (Signed)
 Chronic, ongoing. A1c today 8.5%, down from 9.1% but still above goal.  Discussed with him at length.  Did not tolerate Jardiance or Metformin in the past + recalls side effects with Glipizide and Actos.  Recommend he check blood sugars twice a day.  Provided him with sheet to document sugars on with goals at top for sugar levels. Urine ALB 18 Jan 2024, recheck today. - Increase Mounjaro  to 10 MG weekly as is offering benefit to sugar levels and weight. Could consider Glipizide in future at low dose or retrial SGLT2. Discussed with him if cost becomes and issue to reach out to PCP and will place a referral to PharmD. - Recommend he check BS daily fasting in morning with goal <130. - Foot exam up to date. Eye exam needed. - Vaccinations up to date. - Statin on board and ARB.

## 2024-10-07 ENCOUNTER — Ambulatory Visit: Payer: Self-pay | Admitting: Nurse Practitioner

## 2024-10-07 LAB — COMPREHENSIVE METABOLIC PANEL WITH GFR
ALT: 23 [IU]/L (ref 0–44)
AST: 16 [IU]/L (ref 0–40)
Albumin: 4.4 g/dL (ref 3.8–4.9)
Alkaline Phosphatase: 86 [IU]/L (ref 47–123)
BUN/Creatinine Ratio: 15 (ref 9–20)
BUN: 12 mg/dL (ref 6–24)
Bilirubin Total: 1.2 mg/dL (ref 0.0–1.2)
CO2: 23 mmol/L (ref 20–29)
Calcium: 9.2 mg/dL (ref 8.7–10.2)
Chloride: 98 mmol/L (ref 96–106)
Creatinine, Ser: 0.82 mg/dL (ref 0.76–1.27)
Globulin, Total: 2.5 g/dL (ref 1.5–4.5)
Glucose: 244 mg/dL — ABNORMAL HIGH (ref 70–99)
Potassium: 3.5 mmol/L (ref 3.5–5.2)
Sodium: 137 mmol/L (ref 134–144)
Total Protein: 6.9 g/dL (ref 6.0–8.5)
eGFR: 101 mL/min/{1.73_m2}

## 2024-10-07 LAB — LIPID PANEL W/O CHOL/HDL RATIO
Cholesterol, Total: 116 mg/dL (ref 100–199)
HDL: 40 mg/dL
LDL Chol Calc (NIH): 54 mg/dL (ref 0–99)
Triglycerides: 125 mg/dL (ref 0–149)
VLDL Cholesterol Cal: 22 mg/dL (ref 5–40)

## 2024-10-07 LAB — MICROALBUMIN, URINE WAIVED
Creatinine, Urine Waived: 200 mg/dL (ref 10–300)
Microalb, Ur Waived: 30 mg/L — ABNORMAL HIGH (ref 0–19)
Microalb/Creat Ratio: <30 mg/g

## 2024-10-07 NOTE — Progress Notes (Signed)
 Contacted via MyChart  Good morning Ronal, your labs have returned and overall are stable with exception of glucose level which is elevated, this goes along with A1c. Make changes as we discussed and if any issues let me know. Any questions? Keep being amazing!!  Thank you for allowing me to participate in your care.  I appreciate you. Kindest regards, Zaylah Blecha

## 2024-10-12 ENCOUNTER — Telehealth: Payer: Self-pay

## 2024-10-12 ENCOUNTER — Other Ambulatory Visit (HOSPITAL_COMMUNITY): Payer: Self-pay

## 2024-10-12 NOTE — Telephone Encounter (Signed)
 Pharmacy Patient Advocate Encounter   Received notification from [employer] that prior authorization for Mounjaro  7.5MG /0.5ML auto-injectors is required/requested.   Insurance verification completed.   The patient is insured through [employer].   Per test claim: PA required; PA submitted to above mentioned insurance via Latent Key/confirmation #/EOC [facility] Status is pending

## 2024-10-14 ENCOUNTER — Other Ambulatory Visit (HOSPITAL_COMMUNITY): Payer: Self-pay

## 2024-10-16 ENCOUNTER — Other Ambulatory Visit (HOSPITAL_COMMUNITY): Payer: Self-pay

## 2024-10-16 NOTE — Telephone Encounter (Signed)
 Pharmacy Patient Advocate Encounter  Received notification from [facility] that Prior Authorization for  Mounjaro  7.5MG /0.5ML auto-injectors  has been APPROVED from 10/15/24 to 10/15/25. Ran test claim, Copay is $0.00. This test claim was processed through [facility]- copay amounts may vary at other pharmacies due to pharmacy/plan contracts, or as the patient moves through the different stages of their insurance plan.   PA #/Case ID/Reference #: [id number]

## 2025-01-05 ENCOUNTER — Ambulatory Visit: Admitting: Nurse Practitioner
